# Patient Record
Sex: Female | Born: 1980 | Hispanic: No | Marital: Single | Smoking: Former smoker
Health system: Southern US, Community
[De-identification: ages and names within clinical notes are randomized; demographics above are authoritative.]

## PROBLEM LIST (undated history)

## (undated) DIAGNOSIS — J45909 Unspecified asthma, uncomplicated: Secondary | ICD-10-CM

## (undated) DIAGNOSIS — L409 Psoriasis, unspecified: Secondary | ICD-10-CM

## (undated) DIAGNOSIS — S81809A Unspecified open wound, unspecified lower leg, initial encounter: Secondary | ICD-10-CM

## (undated) DIAGNOSIS — B009 Herpesviral infection, unspecified: Secondary | ICD-10-CM

## (undated) DIAGNOSIS — E119 Type 2 diabetes mellitus without complications: Secondary | ICD-10-CM

## (undated) HISTORY — PX: BREAST MASS EXCISION: SHX1267

## (undated) HISTORY — DX: Herpesviral infection, unspecified: B00.9

## (undated) HISTORY — PX: APPENDECTOMY: SHX54

## (undated) HISTORY — DX: Psoriasis, unspecified: L40.9

## (undated) HISTORY — DX: Type 2 diabetes mellitus without complications: E11.9

## (undated) HISTORY — DX: Unspecified asthma, uncomplicated: J45.909

---

## 2007-06-29 ENCOUNTER — Other Ambulatory Visit: Admission: RE | Admit: 2007-06-29 | Discharge: 2007-06-29 | Payer: Self-pay | Admitting: *Deleted

## 2008-05-21 ENCOUNTER — Emergency Department (HOSPITAL_COMMUNITY): Admission: EM | Admit: 2008-05-21 | Discharge: 2008-05-21 | Payer: Self-pay | Admitting: Emergency Medicine

## 2010-05-06 IMAGING — CR DG TIBIA/FIBULA 2V BILAT
4 series · 4 of 4 positions shown · non-contrast
Comparison: None.

CLINICAL DATA: Bilateral lower leg trauma.

BILATERAL TIBIA AND FIBULA - 2 VIEW EACH 05/21/2008:

[view not recorded (1 of 4)]
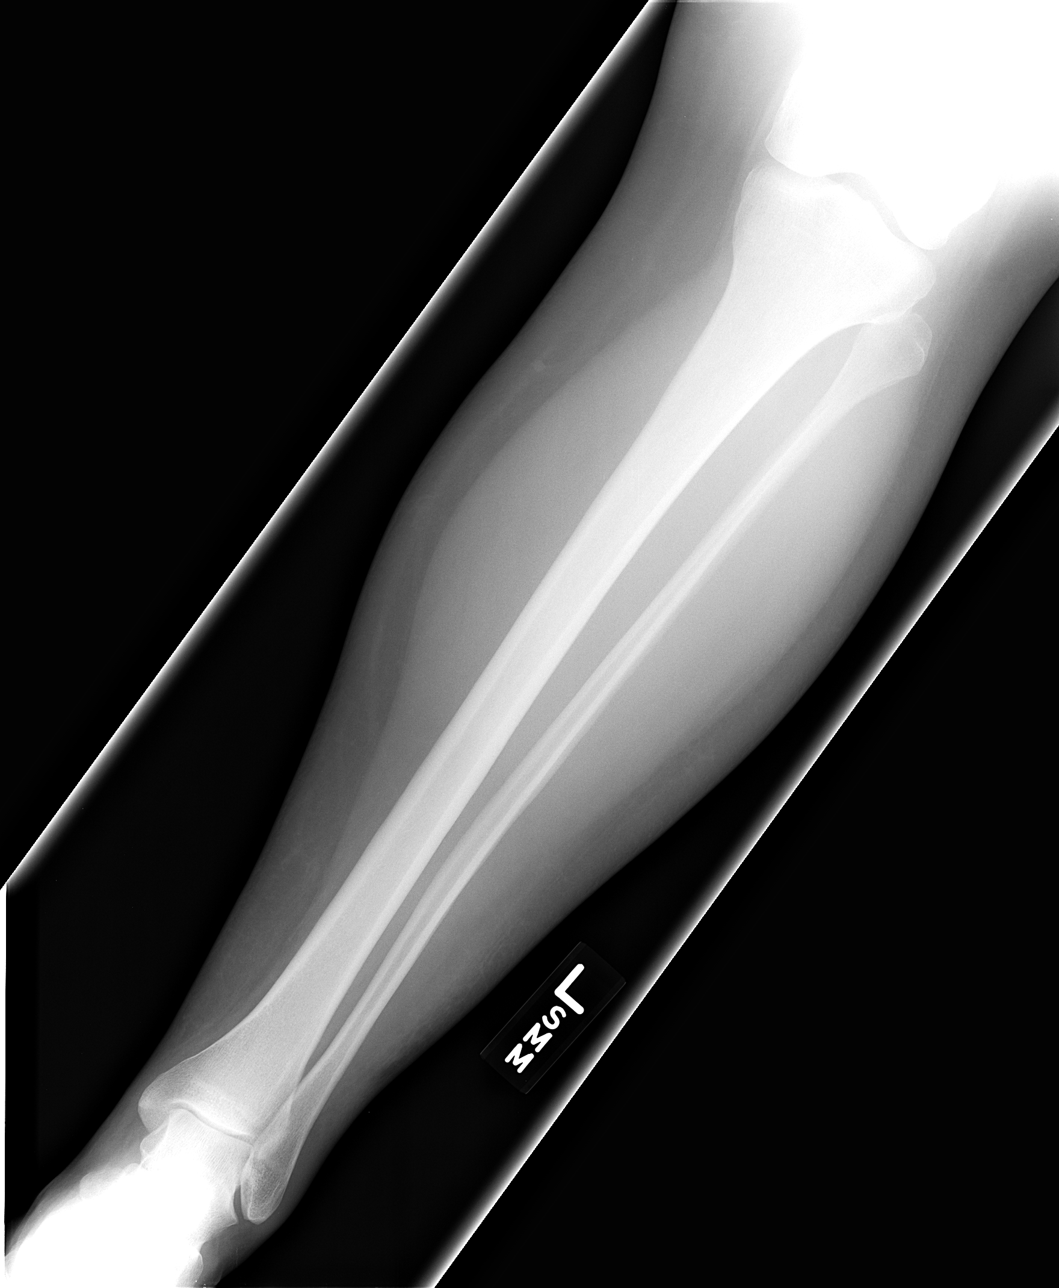

[view not recorded (2 of 4)]
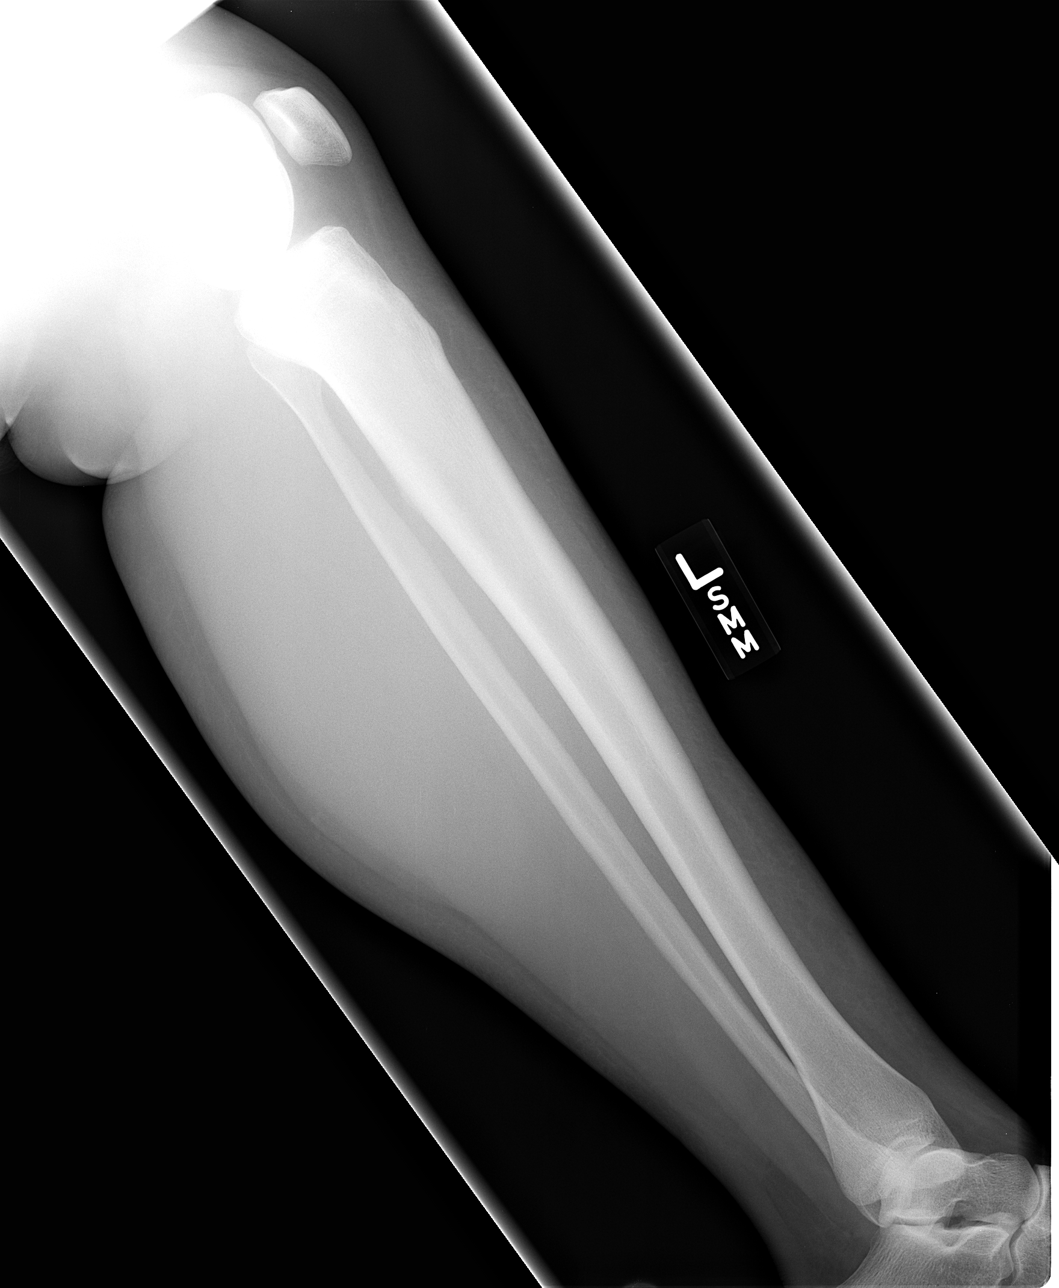

[view not recorded (3 of 4)]
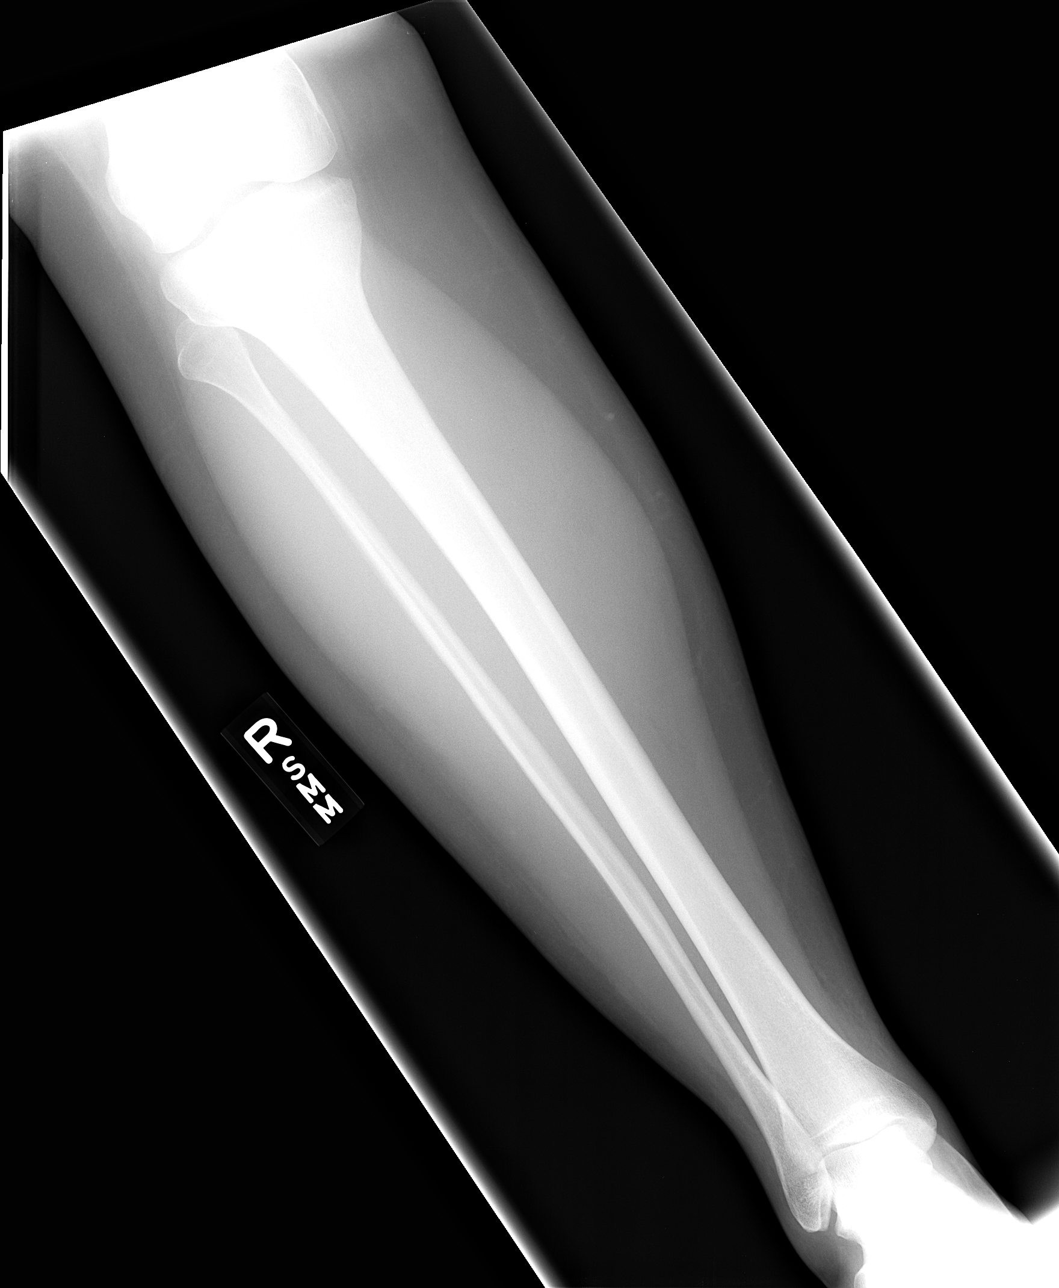

[view not recorded (4 of 4)]
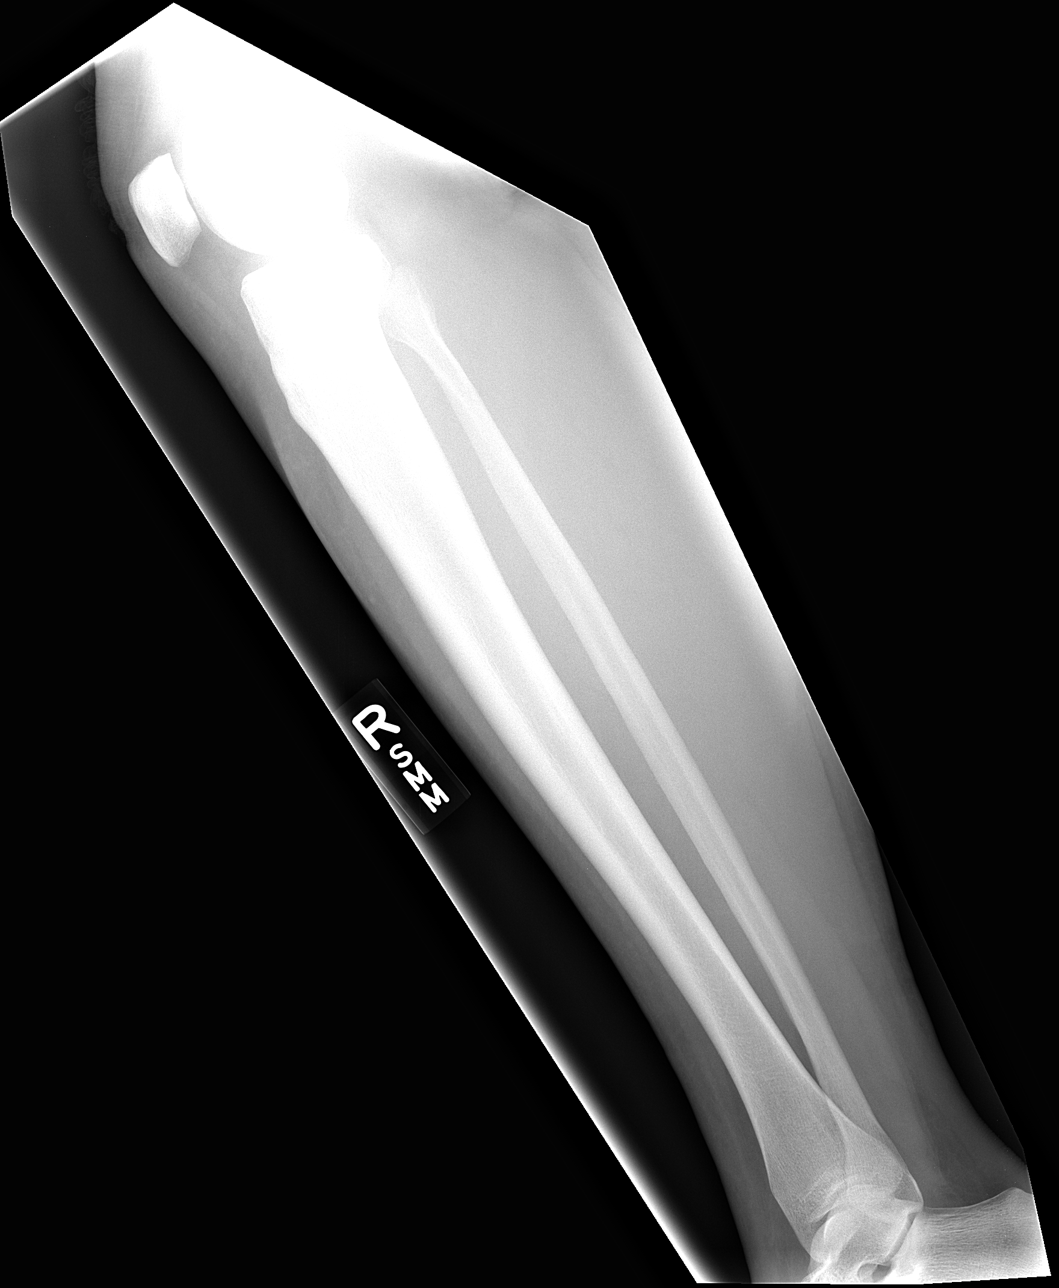

[4 of 4 positions shown; findings below may reference images not displayed]

FINDINGS: Left:  No evidence of acute fracture involving the tibia
or fibula.  Knee joint and ankle joint intact.  No intrinsic
osseous abnormalities.

Right:  No evidence of acute fracture involving the tibia or
fibula.  Knee joint and ankle joint intact.  No intrinsic osseous
abnormalities.
IMPRESSION: Normal bilateral tibia-fibula.

REF:G3 DICTATED: 05/21/2008 [DATE]

## 2010-08-01 ENCOUNTER — Ambulatory Visit: Payer: Self-pay | Admitting: Psychology

## 2010-08-06 ENCOUNTER — Ambulatory Visit: Payer: Self-pay | Admitting: Psychology

## 2010-08-06 ENCOUNTER — Ambulatory Visit (INDEPENDENT_AMBULATORY_CARE_PROVIDER_SITE_OTHER): Payer: BC Managed Care – PPO | Admitting: Psychology

## 2010-08-06 DIAGNOSIS — F331 Major depressive disorder, recurrent, moderate: Secondary | ICD-10-CM

## 2010-08-06 DIAGNOSIS — F411 Generalized anxiety disorder: Secondary | ICD-10-CM

## 2010-08-20 ENCOUNTER — Ambulatory Visit (INDEPENDENT_AMBULATORY_CARE_PROVIDER_SITE_OTHER): Payer: BC Managed Care – PPO | Admitting: Psychology

## 2010-08-20 DIAGNOSIS — F411 Generalized anxiety disorder: Secondary | ICD-10-CM

## 2010-08-20 DIAGNOSIS — F331 Major depressive disorder, recurrent, moderate: Secondary | ICD-10-CM

## 2010-08-27 ENCOUNTER — Ambulatory Visit: Payer: BC Managed Care – PPO | Admitting: Psychology

## 2010-09-02 ENCOUNTER — Ambulatory Visit (INDEPENDENT_AMBULATORY_CARE_PROVIDER_SITE_OTHER): Payer: BC Managed Care – PPO | Admitting: Psychology

## 2010-09-02 DIAGNOSIS — F331 Major depressive disorder, recurrent, moderate: Secondary | ICD-10-CM

## 2010-09-02 DIAGNOSIS — F411 Generalized anxiety disorder: Secondary | ICD-10-CM

## 2010-09-10 ENCOUNTER — Ambulatory Visit (INDEPENDENT_AMBULATORY_CARE_PROVIDER_SITE_OTHER): Payer: BC Managed Care – PPO | Admitting: Psychology

## 2010-09-10 DIAGNOSIS — F411 Generalized anxiety disorder: Secondary | ICD-10-CM

## 2010-09-10 DIAGNOSIS — F331 Major depressive disorder, recurrent, moderate: Secondary | ICD-10-CM

## 2010-09-12 ENCOUNTER — Ambulatory Visit: Payer: BC Managed Care – PPO | Admitting: Psychology

## 2010-09-17 ENCOUNTER — Ambulatory Visit (INDEPENDENT_AMBULATORY_CARE_PROVIDER_SITE_OTHER): Payer: BC Managed Care – PPO | Admitting: Psychology

## 2010-09-17 DIAGNOSIS — F331 Major depressive disorder, recurrent, moderate: Secondary | ICD-10-CM

## 2010-09-17 DIAGNOSIS — F411 Generalized anxiety disorder: Secondary | ICD-10-CM

## 2010-09-24 ENCOUNTER — Ambulatory Visit (INDEPENDENT_AMBULATORY_CARE_PROVIDER_SITE_OTHER): Payer: BC Managed Care – PPO | Admitting: Psychology

## 2010-09-24 DIAGNOSIS — F331 Major depressive disorder, recurrent, moderate: Secondary | ICD-10-CM

## 2010-09-24 DIAGNOSIS — F411 Generalized anxiety disorder: Secondary | ICD-10-CM

## 2010-09-30 ENCOUNTER — Ambulatory Visit (INDEPENDENT_AMBULATORY_CARE_PROVIDER_SITE_OTHER): Payer: BC Managed Care – PPO | Admitting: Psychology

## 2010-09-30 DIAGNOSIS — F411 Generalized anxiety disorder: Secondary | ICD-10-CM

## 2010-09-30 DIAGNOSIS — F331 Major depressive disorder, recurrent, moderate: Secondary | ICD-10-CM

## 2010-10-15 ENCOUNTER — Ambulatory Visit: Payer: BC Managed Care – PPO | Admitting: Psychology

## 2010-10-21 ENCOUNTER — Ambulatory Visit (INDEPENDENT_AMBULATORY_CARE_PROVIDER_SITE_OTHER): Payer: BC Managed Care – PPO | Admitting: Psychology

## 2010-10-21 DIAGNOSIS — F331 Major depressive disorder, recurrent, moderate: Secondary | ICD-10-CM

## 2010-10-21 DIAGNOSIS — F411 Generalized anxiety disorder: Secondary | ICD-10-CM

## 2010-10-29 ENCOUNTER — Ambulatory Visit (INDEPENDENT_AMBULATORY_CARE_PROVIDER_SITE_OTHER): Payer: BC Managed Care – PPO | Admitting: Psychology

## 2010-10-29 DIAGNOSIS — F331 Major depressive disorder, recurrent, moderate: Secondary | ICD-10-CM

## 2010-10-29 DIAGNOSIS — F411 Generalized anxiety disorder: Secondary | ICD-10-CM

## 2010-11-05 ENCOUNTER — Ambulatory Visit (INDEPENDENT_AMBULATORY_CARE_PROVIDER_SITE_OTHER): Payer: BC Managed Care – PPO | Admitting: Psychology

## 2010-11-05 DIAGNOSIS — F411 Generalized anxiety disorder: Secondary | ICD-10-CM

## 2010-11-05 DIAGNOSIS — F331 Major depressive disorder, recurrent, moderate: Secondary | ICD-10-CM

## 2010-11-12 ENCOUNTER — Ambulatory Visit (INDEPENDENT_AMBULATORY_CARE_PROVIDER_SITE_OTHER): Payer: BC Managed Care – PPO | Admitting: Psychology

## 2010-11-12 DIAGNOSIS — F331 Major depressive disorder, recurrent, moderate: Secondary | ICD-10-CM

## 2010-11-12 DIAGNOSIS — F411 Generalized anxiety disorder: Secondary | ICD-10-CM

## 2010-11-19 ENCOUNTER — Ambulatory Visit (INDEPENDENT_AMBULATORY_CARE_PROVIDER_SITE_OTHER): Payer: BC Managed Care – PPO | Admitting: Psychology

## 2010-11-19 DIAGNOSIS — F411 Generalized anxiety disorder: Secondary | ICD-10-CM

## 2010-11-19 DIAGNOSIS — F331 Major depressive disorder, recurrent, moderate: Secondary | ICD-10-CM

## 2010-11-26 ENCOUNTER — Ambulatory Visit: Payer: BC Managed Care – PPO | Admitting: Psychology

## 2016-02-14 ENCOUNTER — Ambulatory Visit (INDEPENDENT_AMBULATORY_CARE_PROVIDER_SITE_OTHER): Payer: BLUE CROSS/BLUE SHIELD | Admitting: Urgent Care

## 2016-02-14 ENCOUNTER — Encounter: Payer: Self-pay | Admitting: Urgent Care

## 2016-02-14 VITALS — BP 110/68 | HR 84 | Temp 98.2°F | Resp 18 | Ht 63.0 in | Wt 176.0 lb

## 2016-02-14 DIAGNOSIS — B9789 Other viral agents as the cause of diseases classified elsewhere: Secondary | ICD-10-CM

## 2016-02-14 DIAGNOSIS — J069 Acute upper respiratory infection, unspecified: Secondary | ICD-10-CM | POA: Diagnosis not present

## 2016-02-14 DIAGNOSIS — J45909 Unspecified asthma, uncomplicated: Secondary | ICD-10-CM

## 2016-02-14 DIAGNOSIS — R0981 Nasal congestion: Secondary | ICD-10-CM

## 2016-02-14 DIAGNOSIS — R05 Cough: Secondary | ICD-10-CM

## 2016-02-14 DIAGNOSIS — J029 Acute pharyngitis, unspecified: Secondary | ICD-10-CM

## 2016-02-14 DIAGNOSIS — R52 Pain, unspecified: Secondary | ICD-10-CM | POA: Diagnosis not present

## 2016-02-14 DIAGNOSIS — E119 Type 2 diabetes mellitus without complications: Secondary | ICD-10-CM | POA: Diagnosis not present

## 2016-02-14 DIAGNOSIS — R059 Cough, unspecified: Secondary | ICD-10-CM

## 2016-02-14 LAB — POCT RAPID STREP A (OFFICE): RAPID STREP A SCREEN: NEGATIVE

## 2016-02-14 LAB — POCT INFLUENZA A/B
Influenza A, POC: NEGATIVE
Influenza B, POC: NEGATIVE

## 2016-02-14 MED ORDER — PSEUDOEPHEDRINE HCL ER 120 MG PO TB12: 120.0000 mg | ORAL_TABLET | Freq: Two times a day (BID) | ORAL | 3 refills | Status: DC

## 2016-02-14 MED ORDER — HYDROCODONE-HOMATROPINE 5-1.5 MG/5ML PO SYRP: 5.0000 mL | ORAL_SOLUTION | Freq: Every evening | ORAL | 0 refills | Status: DC | PRN

## 2016-02-14 MED ORDER — BENZONATATE 100 MG PO CAPS: 100.0000 mg | ORAL_CAPSULE | Freq: Three times a day (TID) | ORAL | 0 refills | Status: DC | PRN

## 2016-02-14 MED ORDER — ALBUTEROL SULFATE HFA 108 (90 BASE) MCG/ACT IN AERS: 2.0000 | INHALATION_SPRAY | Freq: Four times a day (QID) | RESPIRATORY_TRACT | 5 refills | Status: AC | PRN

## 2016-02-14 NOTE — Patient Instructions (Addendum)
Upper Respiratory Infection, Adult Most upper respiratory infections (URIs) are a viral infection of the air passages leading to the lungs. A URI affects the nose, throat, and upper air passages. The most common type of URI is nasopharyngitis and is typically referred to as "the common cold." URIs run their course and usually go away on their own. Most of the time, a URI does not require medical attention, but sometimes a bacterial infection in the upper airways can follow a viral infection. This is called a secondary infection. Sinus and middle ear infections are common types of secondary upper respiratory infections. Bacterial pneumonia can also complicate a URI. A URI can worsen asthma and chronic obstructive pulmonary disease (COPD). Sometimes, these complications can require emergency medical care and may be life threatening.  CAUSES Almost all URIs are caused by viruses. A virus is a type of germ and can spread from one person to another.  RISKS FACTORS You may be at risk for a URI if:   You smoke.   You have chronic heart or lung disease.  You have a weakened defense (immune) system.   You are very young or very old.   You have nasal allergies or asthma.  You work in crowded or poorly ventilated areas.  You work in health care facilities or schools. SIGNS AND SYMPTOMS  Symptoms typically develop 2-3 days after you come in contact with a cold virus. Most viral URIs last 7-10 days. However, viral URIs from the influenza virus (flu virus) can last 14-18 days and are typically more severe. Symptoms may include:   Runny or stuffy (congested) nose.   Sneezing.   Cough.   Sore throat.   Headache.   Fatigue.   Fever.   Loss of appetite.   Pain in your forehead, behind your eyes, and over your cheekbones (sinus pain).  Muscle aches.  DIAGNOSIS  Your health care provider may diagnose a URI by:  Physical exam.  Tests to check that your symptoms are not due to  another condition such as:  Strep throat.  Sinusitis.  Pneumonia.  Asthma. TREATMENT  A URI goes away on its own with time. It cannot be cured with medicines, but medicines may be prescribed or recommended to relieve symptoms. Medicines may help:  Reduce your fever.  Reduce your cough.  Relieve nasal congestion. HOME CARE INSTRUCTIONS   Take medicines only as directed by your health care provider.   Gargle warm saltwater or take cough drops to comfort your throat as directed by your health care provider.  Use a warm mist humidifier or inhale steam from a shower to increase air moisture. This may make it easier to breathe.  Drink enough fluid to keep your urine clear or pale yellow.   Eat soups and other clear broths and maintain good nutrition.   Rest as needed.   Return to work when your temperature has returned to normal or as your health care provider advises. You may need to stay home longer to avoid infecting others. You can also use a face mask and careful hand washing to prevent spread of the virus.  Increase the usage of your inhaler if you have asthma.   Do not use any tobacco products, including cigarettes, chewing tobacco, or electronic cigarettes. If you need help quitting, ask your health care provider. PREVENTION  The best way to protect yourself from getting a cold is to practice good hygiene.   Avoid oral or hand contact with people with cold   symptoms.   Wash your hands often if contact occurs.  There is no clear evidence that vitamin C, vitamin E, echinacea, or exercise reduces the chance of developing a cold. However, it is always recommended to get plenty of rest, exercise, and practice good nutrition.  SEEK MEDICAL CARE IF:   You are getting worse rather than better.   Your symptoms are not controlled by medicine.   You have chills.  You have worsening shortness of breath.  You have brown or red mucus.  You have yellow or brown nasal  discharge.  You have pain in your face, especially when you bend forward.  You have a fever.  You have swollen neck glands.  You have pain while swallowing.  You have white areas in the back of your throat. SEEK IMMEDIATE MEDICAL CARE IF:   You have severe or persistent:  Headache.  Ear pain.  Sinus pain.  Chest pain.  You have chronic lung disease and any of the following:  Wheezing.  Prolonged cough.  Coughing up blood.  A change in your usual mucus.  You have a stiff neck.  You have changes in your:  Vision.  Hearing.  Thinking.  Mood. MAKE SURE YOU:   Understand these instructions.  Will watch your condition.  Will get help right away if you are not doing well or get worse.   This information is not intended to replace advice given to you by your health care provider. Make sure you discuss any questions you have with your health care provider.   Document Released: 09/30/2000 Document Revised: 08/21/2014 Document Reviewed: 07/12/2013 Elsevier Interactive Patient Education 2016 [employer]    Cough, Adult Coughing is a reflex that clears your throat and your airways. Coughing helps to heal and protect your lungs. It is normal to cough occasionally, but a cough that happens with other symptoms or lasts a long time may be a sign of a condition that needs treatment. A cough may last only 2-3 weeks (acute), or it may last longer than 8 weeks (chronic). CAUSES Coughing is commonly caused by:  Breathing in substances that irritate your lungs.  A viral or bacterial respiratory infection.  Allergies.  Asthma.  Postnasal drip.  Smoking.  Acid backing up from the stomach into the esophagus (gastroesophageal reflux).  Certain medicines.  Chronic lung problems, including COPD (or rarely, lung cancer).  Other medical conditions such as heart failure. HOME CARE INSTRUCTIONS  Pay attention to any changes in your symptoms. Take these actions to  help with your discomfort:  Take medicines only as told by your health care provider.  If you were prescribed an antibiotic medicine, take it as told by your health care provider. Do not stop taking the antibiotic even if you start to feel better.  Talk with your health care provider before you take a cough suppressant medicine.  Drink enough fluid to keep your urine clear or pale yellow.  If the air is dry, use a cold steam vaporizer or humidifier in your bedroom or your home to help loosen secretions.  Avoid anything that causes you to cough at work or at home.  If your cough is worse at night, try sleeping in a semi-upright position.  Avoid cigarette smoke. If you smoke, quit smoking. If you need help quitting, ask your health care provider.  Avoid caffeine.  Avoid alcohol.  Rest as needed. SEEK MEDICAL CARE IF:   You have new symptoms.  You cough up pus.  Your cough does not get better after 2-3 weeks, or your cough gets worse.  You cannot control your cough with suppressant medicines and you are losing sleep.  You develop pain that is getting worse or pain that is not controlled with pain medicines.  You have a fever.  You have unexplained weight loss.  You have night sweats. SEEK IMMEDIATE MEDICAL CARE IF:  You cough up blood.  You have difficulty breathing.  Your heartbeat is very fast.   This information is not intended to replace advice given to you by your health care provider. Make sure you discuss any questions you have with your health care provider.   Document Released: 10/03/2010 Document Revised: 12/26/2014 Document Reviewed: 06/13/2014 Elsevier Interactive Patient Education 2016 [employer].     IF you received an x-ray today, you will receive an invoice from [facility]. Please contact [facility] at [phone] with questions or concerns regarding your invoice.   IF you received labwork today, you will receive an  invoice from [employer]. Please contact [facility] at [phone] with questions or concerns regarding your invoice.   Our billing staff will not be able to assist you with questions regarding bills from these companies.  You will be contacted with the lab results as soon as they are available. The fastest way to get your results is to activate your My Chart account. Instructions are located on the last page of this paperwork. If you have not heard from [place] regarding the results in 2 weeks, please contact this office.

## 2016-02-14 NOTE — Progress Notes (Addendum)
By signing my name below, I, Danielle Peck, attest that this documentation has been prepared under the direction and in the presence of Jaynee Eagles, [place].  Electronically Signed: Verlee Monte, Medical Scribe. 02/14/16. 11:56 AM.  MRN: [id number] DOB: 06-Nov-1980  Subjective:   Danielle Peck is a 35 y.o. female with a PMHx of asthma and DM presenting for chief complaint of sore throat onset yesterday. Reports dry cough, shob, chest pain, chest tightness, chest congestion, headache, sore throat, dizziness, sinus pain, bilateral ear pain, decreased appetite and myalgia. She would like to get a refill on albuterol given her shob. Denies having had her flu shot this season. 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 Blood sugar is about 120 in the morning and she's compliant with Victoza. Pt states she is prone to ear infections and pneumonia. She drinks 1 alcohol drink per day and smokes cigarettes, but refuses to report the quantity. Denies PMHx of COPD. Denies fever, nausea, vomiting, abdominal pain, ear drainage, and rashes.  Current Meds  Medication Sig   albuterol (PROVENTIL HFA;VENTOLIN HFA) 108 (90 Base) MCG/ACT inhaler Inhale into the lungs every 6 (six) hours as needed for wheezing or shortness of breath.   ALPRAZolam (XANAX) 0.5 MG tablet Take 0.5 mg by mouth at bedtime as needed for anxiety.   liraglutide (VICTOZA) 18 MG/3ML SOPN Inject into the skin.   sertraline (ZOLOFT) 25 MG tablet Take 25 mg by mouth daily.   Danielle Peck has No Known Allergies.  Danielle Peck  has a past medical history of Asthma; Diabetes mellitus without complication ([place]); HSV-2 infection; and Psoriasis. Also  has a past surgical history that includes Appendectomy.  Objective:   Vitals: BP 110/68 (BP Location: Right Arm, Patient Position: Sitting, Cuff Size: Small)    Pulse 84    Temp 98.2 F (36.8 C) (Oral)    Resp 18    Ht 5\' 3"  (1.6 m)    Wt 176 lb (79.8 kg)    SpO2 97%    BMI 31.18 kg/m    Physical Exam  Constitutional: She is oriented to person, place, and time. She appears well-developed and well-nourished.  HENT:  Right Ear: Tympanic membrane and ear canal normal.  Left Ear: Tympanic membrane and ear canal normal.  Nose: Right sinus exhibits no maxillary sinus tenderness and no frontal sinus tenderness. Left sinus exhibits no maxillary sinus tenderness and no frontal sinus tenderness.  Mouth/Throat: Posterior oropharyngeal erythema present.  TM's intact bilaterally, no effusions or erythema. Nasal turbinates erythematous and mildly edematous but nasal passages patent. No sinus tenderness. Oropharynx with slight erythema and mild post-nasal drainage but no exudates, mucous membranes moist, dentition in good repair.  Eyes: Right eye exhibits no discharge. Left eye exhibits no discharge. No scleral icterus.  Cardiovascular: Normal rate, regular rhythm and intact distal pulses.  Exam reveals no gallop and no friction rub.   No murmur heard. Pulmonary/Chest: No respiratory distress. She has no wheezes. She has no rales.  Lymphadenopathy:    She has no cervical adenopathy.  Neurological: She is alert and oriented to person, place, and time.  Skin: Skin is warm and dry.   Results for orders placed or performed in visit on 02/14/16 (from the past 24 hour(s))  POCT Influenza A/B     Status: None   Collection Time: 02/14/16 12:27 PM  Result Value Ref Range   Influenza A, POC Negative Negative   Influenza B, POC Negative Negative  POCT rapid  strep A     Status: None   Collection Time: 02/14/16 12:27 PM  Result Value Ref Range   Rapid Strep A Screen Negative Negative   Assessment and Plan :   1. Viral URI with cough 2. Cough 3. Sore throat 4. Body aches 5. Sinus congestion - Recommended supportive care, cough suppression. Provided with 2 work notes for her 2 jobs. RTC in 1 week if no improvement.  6. Extrinsic asthma without complication, unspecified asthma severity,  unspecified whether persistent - Refilled albuterol inhaler. Schedule 2-3 times daily due to cough, shob.   7. Type 2 diabetes mellitus without complication, without long-term current use of insulin (HCC) - Stable per patient's report of fasting blood sugar checks.   Jaynee Eagles, PA-C [facility] [phone] 02/14/2016 11:56 AM

## 2016-02-14 NOTE — Addendum Note (Signed)
Addended by: Burnis Kingfisher on: 02/14/2016 05:21 PM   Modules accepted: Orders

## 2016-02-16 LAB — CULTURE, GROUP A STREP

## 2016-02-19 ENCOUNTER — Encounter: Payer: Self-pay | Admitting: Urgent Care

## 2016-04-20 DIAGNOSIS — S81809A Unspecified open wound, unspecified lower leg, initial encounter: Secondary | ICD-10-CM

## 2016-04-20 HISTORY — DX: Unspecified open wound, unspecified lower leg, initial encounter: S81.809A

## 2016-06-22 ENCOUNTER — Other Ambulatory Visit: Payer: Self-pay | Admitting: Obstetrics and Gynecology

## 2016-06-22 DIAGNOSIS — N63 Unspecified lump in unspecified breast: Secondary | ICD-10-CM

## 2016-06-24 ENCOUNTER — Ambulatory Visit
Admission: RE | Admit: 2016-06-24 | Discharge: 2016-06-24 | Disposition: A | Discharge status: Home / Self Care | Payer: BC Managed Care – PPO | Source: Ambulatory Visit | Attending: Obstetrics and Gynecology | Admitting: Obstetrics and Gynecology

## 2016-06-24 ENCOUNTER — Other Ambulatory Visit: Payer: Self-pay | Admitting: Obstetrics and Gynecology

## 2016-06-24 DIAGNOSIS — N63 Unspecified lump in unspecified breast: Secondary | ICD-10-CM

## 2016-06-24 DIAGNOSIS — N611 Abscess of the breast and nipple: Secondary | ICD-10-CM

## 2016-06-29 LAB — AEROBIC/ANAEROBIC CULTURE W GRAM STAIN (SURGICAL/DEEP WOUND)

## 2016-06-29 LAB — AEROBIC/ANAEROBIC CULTURE (SURGICAL/DEEP WOUND)

## 2016-06-30 ENCOUNTER — Ambulatory Visit
Admission: RE | Admit: 2016-06-30 | Discharge: 2016-06-30 | Disposition: A | Discharge status: Home / Self Care | Payer: BC Managed Care – PPO | Source: Ambulatory Visit | Attending: Obstetrics and Gynecology | Admitting: Obstetrics and Gynecology

## 2016-06-30 ENCOUNTER — Other Ambulatory Visit: Payer: Self-pay | Admitting: Obstetrics and Gynecology

## 2016-06-30 ENCOUNTER — Other Ambulatory Visit: Payer: BC Managed Care – PPO

## 2016-06-30 DIAGNOSIS — N611 Abscess of the breast and nipple: Secondary | ICD-10-CM

## 2016-07-03 ENCOUNTER — Encounter (HOSPITAL_COMMUNITY): Admission: EM | Disposition: A | Discharge status: Home / Self Care | Payer: Self-pay | Source: Home / Self Care

## 2016-07-03 ENCOUNTER — Inpatient Hospital Stay (HOSPITAL_COMMUNITY)
Admission: EM | Admit: 2016-07-03 | Discharge: 2016-07-06 | DRG: 855 | Disposition: A | Discharge status: Home / Self Care | Payer: BLUE CROSS/BLUE SHIELD | Attending: General Surgery | Admitting: General Surgery

## 2016-07-03 ENCOUNTER — Encounter (HOSPITAL_COMMUNITY): Payer: Self-pay | Admitting: *Deleted

## 2016-07-03 ENCOUNTER — Emergency Department (HOSPITAL_COMMUNITY): Payer: BLUE CROSS/BLUE SHIELD | Admitting: Anesthesiology

## 2016-07-03 ENCOUNTER — Ambulatory Visit
Admission: RE | Admit: 2016-07-03 | Discharge: 2016-07-03 | Disposition: A | Discharge status: Home / Self Care | Payer: BC Managed Care – PPO | Source: Ambulatory Visit | Attending: Obstetrics and Gynecology | Admitting: Obstetrics and Gynecology

## 2016-07-03 DIAGNOSIS — N611 Abscess of the breast and nipple: Secondary | ICD-10-CM | POA: Diagnosis present

## 2016-07-03 DIAGNOSIS — L409 Psoriasis, unspecified: Secondary | ICD-10-CM | POA: Diagnosis present

## 2016-07-03 DIAGNOSIS — M79661 Pain in right lower leg: Secondary | ICD-10-CM | POA: Diagnosis not present

## 2016-07-03 DIAGNOSIS — M79662 Pain in left lower leg: Secondary | ICD-10-CM | POA: Diagnosis not present

## 2016-07-03 DIAGNOSIS — E1165 Type 2 diabetes mellitus with hyperglycemia: Secondary | ICD-10-CM | POA: Diagnosis present

## 2016-07-03 DIAGNOSIS — M79609 Pain in unspecified limb: Secondary | ICD-10-CM | POA: Diagnosis not present

## 2016-07-03 DIAGNOSIS — A419 Sepsis, unspecified organism: Principal | ICD-10-CM | POA: Diagnosis present

## 2016-07-03 DIAGNOSIS — F419 Anxiety disorder, unspecified: Secondary | ICD-10-CM | POA: Diagnosis present

## 2016-07-03 DIAGNOSIS — J45909 Unspecified asthma, uncomplicated: Secondary | ICD-10-CM | POA: Diagnosis present

## 2016-07-03 DIAGNOSIS — F329 Major depressive disorder, single episode, unspecified: Secondary | ICD-10-CM | POA: Diagnosis present

## 2016-07-03 DIAGNOSIS — B951 Streptococcus, group B, as the cause of diseases classified elsewhere: Secondary | ICD-10-CM | POA: Diagnosis present

## 2016-07-03 HISTORY — PX: INCISION AND DRAINAGE OF WOUND: SHX1803

## 2016-07-03 LAB — CBC
HCT: 40.2 % (ref 36.0–46.0)
Hemoglobin: 14 g/dL (ref 12.0–15.0)
MCH: 31.8 pg (ref 26.0–34.0)
MCHC: 34.8 g/dL (ref 30.0–36.0)
MCV: 91.4 fL (ref 78.0–100.0)
PLATELETS: 308 10*3/uL (ref 150–400)
RBC: 4.4 MIL/uL (ref 3.87–5.11)
RDW: 11.9 % (ref 11.5–15.5)
WBC: 12 10*3/uL — ABNORMAL HIGH (ref 4.0–10.5)

## 2016-07-03 LAB — URINALYSIS, ROUTINE W REFLEX MICROSCOPIC
Bacteria, UA: NONE SEEN
Bilirubin Urine: NEGATIVE
HGB URINE DIPSTICK: NEGATIVE
Ketones, ur: NEGATIVE mg/dL
LEUKOCYTES UA: NEGATIVE
NITRITE: NEGATIVE
PH: 6 (ref 5.0–8.0)
Protein, ur: NEGATIVE mg/dL
SPECIFIC GRAVITY, URINE: 1.033 — AB (ref 1.005–1.030)

## 2016-07-03 LAB — COMPREHENSIVE METABOLIC PANEL
ALK PHOS: 69 U/L (ref 38–126)
ALT: 15 U/L (ref 14–54)
ANION GAP: 10 (ref 5–15)
AST: 19 U/L (ref 15–41)
Albumin: 3.8 g/dL (ref 3.5–5.0)
BUN: 7 mg/dL (ref 6–20)
CALCIUM: 8.8 mg/dL — AB (ref 8.9–10.3)
CHLORIDE: 101 mmol/L (ref 101–111)
CO2: 21 mmol/L — ABNORMAL LOW (ref 22–32)
CREATININE: 0.54 mg/dL (ref 0.44–1.00)
Glucose, Bld: 448 mg/dL — ABNORMAL HIGH (ref 65–99)
Potassium: 4 mmol/L (ref 3.5–5.1)
Sodium: 132 mmol/L — ABNORMAL LOW (ref 135–145)
Total Bilirubin: 0.4 mg/dL (ref 0.3–1.2)
Total Protein: 7 g/dL (ref 6.5–8.1)

## 2016-07-03 LAB — I-STAT VENOUS BLOOD GAS, ED
Acid-base deficit: 3 mmol/L — ABNORMAL HIGH (ref 0.0–2.0)
Bicarbonate: 22 mmol/L (ref 20.0–28.0)
O2 Saturation: 59 %
TCO2: 23 mmol/L (ref 0–100)
pCO2, Ven: 37.6 mmHg — ABNORMAL LOW (ref 44.0–60.0)
pH, Ven: 7.375 (ref 7.250–7.430)
pO2, Ven: 31 mmHg — CL (ref 32.0–45.0)

## 2016-07-03 LAB — GLUCOSE, CAPILLARY
GLUCOSE-CAPILLARY: 338 mg/dL — AB (ref 65–99)
Glucose-Capillary: 273 mg/dL — ABNORMAL HIGH (ref 65–99)
Glucose-Capillary: 282 mg/dL — ABNORMAL HIGH (ref 65–99)

## 2016-07-03 LAB — I-STAT CG4 LACTIC ACID, ED: LACTIC ACID, VENOUS: 3.8 mmol/L — AB (ref 0.5–1.9)

## 2016-07-03 SURGERY — IRRIGATION AND DEBRIDEMENT WOUND
Anesthesia: General | Laterality: Right

## 2016-07-03 MED ORDER — SUCCINYLCHOLINE CHLORIDE 20 MG/ML IJ SOLN
INTRAMUSCULAR | Status: DC | PRN
  Administered 2016-07-03: 100 mg via INTRAVENOUS

## 2016-07-03 MED ORDER — SUFENTANIL CITRATE 50 MCG/ML IV SOLN
INTRAVENOUS | Status: AC
  Filled 2016-07-03: qty 1

## 2016-07-03 MED ORDER — HYDROMORPHONE HCL 1 MG/ML IJ SOLN
0.2500 mg | INTRAMUSCULAR | Status: DC | PRN
  Administered 2016-07-03 (×3): 0.5 mg via INTRAVENOUS

## 2016-07-03 MED ORDER — ONDANSETRON HCL 4 MG/2ML IJ SOLN
INTRAMUSCULAR | Status: AC
  Filled 2016-07-03: qty 2

## 2016-07-03 MED ORDER — PIPERACILLIN-TAZOBACTAM 3.375 G IVPB
3.3750 g | Freq: Four times a day (QID) | INTRAVENOUS | Status: AC
  Administered 2016-07-03: 3.375 g via INTRAVENOUS
  Filled 2016-07-03: qty 50

## 2016-07-03 MED ORDER — VANCOMYCIN HCL IN DEXTROSE 1-5 GM/200ML-% IV SOLN
1000.0000 mg | Freq: Three times a day (TID) | INTRAVENOUS | Status: DC
  Administered 2016-07-04 – 2016-07-05 (×4): 1000 mg via INTRAVENOUS
  Filled 2016-07-03 (×5): qty 200

## 2016-07-03 MED ORDER — PIPERACILLIN-TAZOBACTAM 3.375 G IVPB
3.3750 g | Freq: Three times a day (TID) | INTRAVENOUS | Status: DC
  Administered 2016-07-04 – 2016-07-05 (×4): 3.375 g via INTRAVENOUS
  Filled 2016-07-03 (×5): qty 50

## 2016-07-03 MED ORDER — SERTRALINE HCL 50 MG PO TABS
25.0000 mg | ORAL_TABLET | Freq: Every day | ORAL | Status: DC
  Administered 2016-07-04 – 2016-07-06 (×3): 25 mg via ORAL
  Filled 2016-07-03 (×3): qty 1

## 2016-07-03 MED ORDER — LIDOCAINE 2% (20 MG/ML) 5 ML SYRINGE
INTRAMUSCULAR | Status: AC
  Filled 2016-07-03: qty 5

## 2016-07-03 MED ORDER — SODIUM CHLORIDE 0.9 % IV SOLN
INTRAVENOUS | Status: DC | PRN
  Administered 2016-07-03: 21:00:00 via INTRAVENOUS

## 2016-07-03 MED ORDER — MORPHINE SULFATE (PF) 4 MG/ML IV SOLN
4.0000 mg | Freq: Once | INTRAVENOUS | Status: AC
  Administered 2016-07-03: 4 mg via INTRAVENOUS
  Filled 2016-07-03: qty 1

## 2016-07-03 MED ORDER — SUGAMMADEX SODIUM 200 MG/2ML IV SOLN
INTRAVENOUS | Status: DC | PRN
  Administered 2016-07-03: 200 mg via INTRAVENOUS

## 2016-07-03 MED ORDER — HYDROMORPHONE HCL 1 MG/ML IJ SOLN
INTRAMUSCULAR | Status: AC
  Filled 2016-07-03: qty 1

## 2016-07-03 MED ORDER — HYDROMORPHONE HCL 1 MG/ML IJ SOLN
INTRAMUSCULAR | Status: AC
  Filled 2016-07-03: qty 0.5

## 2016-07-03 MED ORDER — PROPOFOL 10 MG/ML IV BOLUS
INTRAVENOUS | Status: AC
  Filled 2016-07-03: qty 20

## 2016-07-03 MED ORDER — PIPERACILLIN-TAZOBACTAM 3.375 G IVPB 30 MIN
3.3750 g | INTRAVENOUS | Status: DC
  Filled 2016-07-03 (×2): qty 50

## 2016-07-03 MED ORDER — ALBUTEROL SULFATE (2.5 MG/3ML) 0.083% IN NEBU: 3.0000 mL | INHALATION_SOLUTION | Freq: Four times a day (QID) | RESPIRATORY_TRACT | Status: DC | PRN

## 2016-07-03 MED ORDER — SODIUM CHLORIDE 0.9 % IV BOLUS (SEPSIS)
1000.0000 mL | Freq: Once | INTRAVENOUS | Status: AC
  Administered 2016-07-03: 1000 mL via INTRAVENOUS

## 2016-07-03 MED ORDER — INSULIN ASPART 100 UNIT/ML ~~LOC~~ SOLN
SUBCUTANEOUS | Status: AC
  Filled 2016-07-03: qty 1

## 2016-07-03 MED ORDER — ROCURONIUM BROMIDE 100 MG/10ML IV SOLN
INTRAVENOUS | Status: DC | PRN
  Administered 2016-07-03: 20 mg via INTRAVENOUS

## 2016-07-03 MED ORDER — LIDOCAINE HCL (CARDIAC) 20 MG/ML IV SOLN
INTRAVENOUS | Status: DC | PRN
  Administered 2016-07-03: 40 mg via INTRATRACHEAL

## 2016-07-03 MED ORDER — ONDANSETRON 4 MG PO TBDP: 4.0000 mg | ORAL_TABLET | Freq: Four times a day (QID) | ORAL | Status: DC | PRN

## 2016-07-03 MED ORDER — ENOXAPARIN SODIUM 40 MG/0.4ML ~~LOC~~ SOLN
40.0000 mg | SUBCUTANEOUS | Status: DC
  Administered 2016-07-04 – 2016-07-06 (×3): 40 mg via SUBCUTANEOUS
  Filled 2016-07-03 (×3): qty 0.4

## 2016-07-03 MED ORDER — HYDROMORPHONE HCL 1 MG/ML IJ SOLN
INTRAMUSCULAR | Status: DC | PRN
  Administered 2016-07-03 (×2): 0.5 mg via INTRAVENOUS

## 2016-07-03 MED ORDER — SUFENTANIL CITRATE 50 MCG/ML IV SOLN
INTRAVENOUS | Status: DC | PRN
  Administered 2016-07-03: 10 ug via INTRAVENOUS

## 2016-07-03 MED ORDER — SODIUM CHLORIDE 0.9 % IV SOLN
INTRAVENOUS | Status: DC | PRN
  Administered 2016-07-03: 1000 mg via INTRAVENOUS

## 2016-07-03 MED ORDER — HYDROMORPHONE HCL 2 MG/ML IJ SOLN
1.0000 mg | INTRAMUSCULAR | Status: DC | PRN
  Administered 2016-07-04 (×2): 2 mg via INTRAVENOUS
  Administered 2016-07-05: 1 mg via INTRAVENOUS
  Administered 2016-07-05: 2 mg via INTRAVENOUS
  Filled 2016-07-03 (×4): qty 1

## 2016-07-03 MED ORDER — INSULIN ASPART 100 UNIT/ML ~~LOC~~ SOLN
0.0000 [IU] | SUBCUTANEOUS | Status: DC
  Administered 2016-07-03: 8 [IU] via SUBCUTANEOUS
  Filled 2016-07-03 (×49): qty 0.15

## 2016-07-03 MED ORDER — SODIUM CHLORIDE 0.9 % IV SOLN
INTRAVENOUS | Status: DC
  Administered 2016-07-03 – 2016-07-05 (×6): via INTRAVENOUS
  Administered 2016-07-06: 1 mL via INTRAVENOUS

## 2016-07-03 MED ORDER — SUCCINYLCHOLINE CHLORIDE 200 MG/10ML IV SOSY
PREFILLED_SYRINGE | INTRAVENOUS | Status: AC
  Filled 2016-07-03: qty 10

## 2016-07-03 MED ORDER — PROPOFOL 10 MG/ML IV BOLUS
INTRAVENOUS | Status: DC | PRN
  Administered 2016-07-03: 120 mg via INTRAVENOUS

## 2016-07-03 MED ORDER — 0.9 % SODIUM CHLORIDE (POUR BTL) OPTIME
TOPICAL | Status: DC | PRN
  Administered 2016-07-03: 1000 mL

## 2016-07-03 MED ORDER — ALPRAZOLAM 0.5 MG PO TABS
0.5000 mg | ORAL_TABLET | Freq: Every evening | ORAL | Status: DC | PRN
  Administered 2016-07-03 – 2016-07-05 (×3): 0.5 mg via ORAL
  Filled 2016-07-03 (×3): qty 1

## 2016-07-03 MED ORDER — MIDAZOLAM HCL 2 MG/2ML IJ SOLN
INTRAMUSCULAR | Status: DC | PRN
  Administered 2016-07-03: 2 mg via INTRAVENOUS

## 2016-07-03 MED ORDER — HYDROMORPHONE HCL 1 MG/ML IJ SOLN
INTRAMUSCULAR | Status: AC
  Administered 2016-07-03: 0.5 mg via INTRAVENOUS
  Filled 2016-07-03: qty 1

## 2016-07-03 MED ORDER — SODIUM CHLORIDE 0.9 % IJ SOLN
INTRAMUSCULAR | Status: AC
  Filled 2016-07-03: qty 10

## 2016-07-03 MED ORDER — OXYCODONE HCL 5 MG PO TABS
5.0000 mg | ORAL_TABLET | ORAL | Status: DC | PRN
  Administered 2016-07-03 – 2016-07-06 (×13): 10 mg via ORAL
  Filled 2016-07-03 (×13): qty 2

## 2016-07-03 MED ORDER — INSULIN ASPART 100 UNIT/ML IV SOLN
6.0000 [IU] | Freq: Once | INTRAVENOUS | Status: AC
  Administered 2016-07-03: 10 [IU] via INTRAVENOUS
  Filled 2016-07-03: qty 0.06

## 2016-07-03 MED ORDER — ONDANSETRON HCL 4 MG/2ML IJ SOLN
4.0000 mg | Freq: Four times a day (QID) | INTRAMUSCULAR | Status: DC | PRN
  Administered 2016-07-03 – 2016-07-06 (×2): 4 mg via INTRAVENOUS
  Filled 2016-07-03 (×2): qty 2

## 2016-07-03 MED ORDER — ONDANSETRON HCL 4 MG/2ML IJ SOLN
INTRAMUSCULAR | Status: DC | PRN
  Administered 2016-07-03: 4 mg via INTRAVENOUS

## 2016-07-03 MED ORDER — VANCOMYCIN HCL IN DEXTROSE 1-5 GM/200ML-% IV SOLN
INTRAVENOUS | Status: AC
  Filled 2016-07-03: qty 200

## 2016-07-03 MED ORDER — MIDAZOLAM HCL 2 MG/2ML IJ SOLN
INTRAMUSCULAR | Status: AC
  Filled 2016-07-03: qty 2

## 2016-07-03 SURGICAL SUPPLY — 26 items
CANISTER SUCT 3000ML PPV (MISCELLANEOUS) ×3 IMPLANT
COVER SURGICAL LIGHT HANDLE (MISCELLANEOUS) ×3 IMPLANT
DRAPE LAPAROTOMY 100X72 PEDS (DRAPES) ×3 IMPLANT
DRAPE UTILITY XL STRL (DRAPES) ×12 IMPLANT
DRSG PAD ABDOMINAL 8X10 ST (GAUZE/BANDAGES/DRESSINGS) ×3 IMPLANT
ELECT CAUTERY BLADE 6.4 (BLADE) ×3 IMPLANT
ELECT REM PT RETURN 9FT ADLT (ELECTROSURGICAL) ×3
ELECTRODE REM PT RTRN 9FT ADLT (ELECTROSURGICAL) ×1 IMPLANT
GAUZE IODOFORM PACK 1/2 7832 (GAUZE/BANDAGES/DRESSINGS) ×3 IMPLANT
GAUZE SPONGE 4X4 12PLY STRL (GAUZE/BANDAGES/DRESSINGS) ×3 IMPLANT
GLOVE BIO SURGEON STRL SZ7.5 (GLOVE) ×3 IMPLANT
GLOVE BIOGEL PI IND STRL 8 (GLOVE) ×1 IMPLANT
GLOVE BIOGEL PI INDICATOR 8 (GLOVE) ×2
GOWN STRL REUS W/ TWL LRG LVL3 (GOWN DISPOSABLE) ×2 IMPLANT
GOWN STRL REUS W/ TWL XL LVL3 (GOWN DISPOSABLE) ×1 IMPLANT
GOWN STRL REUS W/TWL LRG LVL3 (GOWN DISPOSABLE) ×4
GOWN STRL REUS W/TWL XL LVL3 (GOWN DISPOSABLE) ×2
KIT BASIN OR (CUSTOM PROCEDURE TRAY) ×3 IMPLANT
KIT ROOM TURNOVER OR (KITS) ×3 IMPLANT
NS IRRIG 1000ML POUR BTL (IV SOLUTION) ×3 IMPLANT
PACK GENERAL/GYN (CUSTOM PROCEDURE TRAY) ×3 IMPLANT
PAD ARMBOARD 7.5X6 YLW CONV (MISCELLANEOUS) ×3 IMPLANT
SWAB COLLECTION DEVICE MRSA (MISCELLANEOUS) ×3 IMPLANT
TAPE CLOTH SURG 6X10 WHT LF (GAUZE/BANDAGES/DRESSINGS) ×3 IMPLANT
TOWEL OR 17X26 10 PK STRL BLUE (TOWEL DISPOSABLE) ×3 IMPLANT
TUBE ANAEROBIC SPECIMEN COL (MISCELLANEOUS) ×3 IMPLANT

## 2016-07-03 NOTE — Anesthesia Preprocedure Evaluation (Addendum)
Anesthesia Evaluation  Patient identified by MRN, date of birth, ID band Patient awake    Reviewed: Allergy & Precautions, H&P , NPO status , Patient's Chart, lab work & pertinent test results  Airway Mallampati: II  TM Distance: >3 FB Neck ROM: Full    Dental no notable dental hx. (+) Teeth Intact, Dental Advisory Given   Pulmonary asthma ,    Pulmonary exam normal breath sounds clear to auscultation       Cardiovascular negative cardio ROS   Rhythm:Regular Rate:Normal     Neuro/Psych negative neurological ROS  negative psych ROS   GI/Hepatic negative GI ROS, Neg liver ROS,   Endo/Other  diabetes, Poorly Controlled, Oral Hypoglycemic Agents  Renal/GU negative Renal ROS  negative genitourinary   Musculoskeletal   Abdominal   Peds  Hematology negative hematology ROS (+)   Anesthesia Other Findings   Reproductive/Obstetrics negative OB ROS                            Anesthesia Physical Anesthesia Plan  ASA: II and emergent  Anesthesia Plan: General   Post-op Pain Management:    Induction: Intravenous, Rapid sequence and Cricoid pressure planned  Airway Management Planned: Oral ETT  Additional Equipment:   Intra-op Plan:   Post-operative Plan: Extubation in OR  Informed Consent: I have reviewed the patients History and Physical, chart, labs and discussed the procedure including the risks, benefits and alternatives for the proposed anesthesia with the patient or authorized representative who has indicated his/her understanding and acceptance.   Dental advisory given  Plan Discussed with: CRNA  Anesthesia Plan Comments:        Anesthesia Quick Evaluation

## 2016-07-03 NOTE — Op Note (Signed)
07/03/2016  9:23 PM  PATIENT:  Danielle Peck  36 y.o. female  PRE-OPERATIVE DIAGNOSIS:  Right breast abscess  POST-OPERATIVE DIAGNOSIS:  Right breast abscess  PROCEDURE:  Procedure(s): IRRIGATION AND DEBRIDEMENT WOUND (Right)  SURGEON:  Surgeon(s) and Role:    * Ralene Ok, MD - Primary  ANESTHESIA:   general  EBL:  Total I/O3/16/2018  9:23 PM  PATIENT:  Danielle Rochester Casciano  36 y.o. female  PRE-OPERATIVE DIAGNOSIS:  Right infected breast  POST-OPERATIVE DIAGNOSIS:  Right infected breast  PROCEDURE:  Procedure(s): IRRIGATION AND DEBRIDEMENT WOUND (Right)  SURGEON:  Surgeon(s) and Role:    * Ralene Ok, MD - Primary  PHYSICIAN ASSISTANT:   ASSISTANTS: none   ANESTHESIA:   general  EBL:10cc  Total I/O In: 1000 [I.V.:1000] Out: -   BLOOD ADMINISTERED:none  DRAINS: none   LOCAL MEDICATIONS USED:  NONE  SPECIMEN:  Source of Specimen:  Right breast abscess  DISPOSITION OF SPECIMEN:  Microbiology  COUNTS:  YES  TOURNIQUET:  * No tourniquets in log *  DICTATION: .Dragon Dictation  Indications for procedure: Patient is a 36 year old female who had previously undergone antibiotic therapy as well as needle aspiration of right breast abscess. This continued to progress over 2 weeks. Patient was seen in the ER and was taken to the OR for urgent I&D of right breast abscess.  Details of procedure: After the patient was consented she was taken back to the operating room and placed in supine position with bilateral testes place. After appropriate antibiotic for confirmatory facts verified.  This time a curvilinear incision is made over the area of the breast abscess. Large amount of purulence was expressed. Aerobic and anaerobic cultures were obtained. All loculations of the breast abscess were broken up using blunt dissection. There was approximately a cavity that measured approximate 5 x 6 cm. This tracked from the 7:00 position to towards the subareolar position.  This abscess cavity was irrigated out with sterile saline. At this time the area was checked for hemostasis. The area was then packed with half-inch iodoform gauze. The wound was dressed with 4 x 4's, ABDs pad, and tape. The patient tied the procedure well was taken to the recovery room stable condition.  PLAN OF CARE: Admit to inpatient   PATIENT DISPOSITION:  PACU - hemodynamically stable.

## 2016-07-03 NOTE — H&P (Signed)
Danielle Peck is an 36 y.o. female.   Chief Complaint: right breast  HPI: Pt is a 36 y/o F with what she states a two-week history of a breast abscess. Patient has been seen in the breast clinic over the last 2 weeks. She's been on antibiotics and has had several needle aspirations and ultrasounds. Patient was seen today in was reevaluated with ultrasound which revealed a moderate-sized right breast abscess. Patient was to follow up with [place] in 2 days in clinic however secondary to continued pain patient said to proceed to the ER for further evaluation and management.  Patient states that she feels that the abscesses gotten worse. She states she has pain in her right breast.  Patient is requesting to be taken to the OR for I&D of abscess.  Ultrasound from today reveals: EXAM: ULTRASOUND OF THE RIGHT BREAST  COMPARISON:  Previous exam(s).  FINDINGS: Physical examination of the right breast reveals enlargement and firmness with marked erythema involving the lower outer right breast. There is a purulent drainage emanating from an open wound. The patient is exquisitely tender to palpation.  Targeted ultrasound of the right breast was performed demonstrating a large complex collection compatible with an abscess at the 8 to 9 o'clock position 4-5 cm from nipple. This measures at least 5.6 x 2.6 x 4.5 cm. Due to large size of the abscess, it could not be entirely included in the field of view. There adjacent skin thickening and edema with increased vascularity.  IMPRESSION: Enlarging right breast abscess, despite antibiotics and two ultrasound-guided abscess aspirations.    Past Medical History:  Diagnosis Date  . Asthma   . Diabetes mellitus without complication ([place])   . HSV-2 infection   . Psoriasis     Past Surgical History:  Procedure Laterality Date  . APPENDECTOMY      No family history on file. Social History:  reports that she has never smoked. She has never used  smokeless tobacco. She reports that she drinks alcohol. She reports that she does not use drugs.  Allergies: No Known Allergies   (Not in a hospital admission)  Results for orders placed or performed during the hospital encounter of 07/03/16 (from the past 48 hour(s))  CBC     Status: Abnormal   Collection Time: 07/03/16  2:54 PM  Result Value Ref Range   WBC 12.0 (H) 4.0 - 10.5 K/uL   RBC 4.40 3.87 - 5.11 MIL/uL   Hemoglobin 14.0 12.0 - 15.0 g/dL   HCT 40.2 36.0 - 46.0 %   MCV 91.4 78.0 - 100.0 fL   MCH 31.8 26.0 - 34.0 pg   MCHC 34.8 30.0 - 36.0 g/dL   RDW 11.9 11.5 - 15.5 %   Platelets 308 150 - 400 K/uL  Comprehensive metabolic panel     Status: Abnormal   Collection Time: 07/03/16  2:54 PM  Result Value Ref Range   Sodium 132 (L) 135 - 145 mmol/L   Potassium 4.0 3.5 - 5.1 mmol/L   Chloride 101 101 - 111 mmol/L   CO2 21 (L) 22 - 32 mmol/L   Glucose, Bld 448 (H) 65 - 99 mg/dL   BUN 7 6 - 20 mg/dL   Creatinine, Ser 0.54 0.44 - 1.00 mg/dL   Calcium 8.8 (L) 8.9 - 10.3 mg/dL   Total Protein 7.0 6.5 - 8.1 g/dL   Albumin 3.8 3.5 - 5.0 g/dL   AST 19 15 - 41 U/L   ALT 15 14 -  54 U/L   Alkaline Phosphatase 69 38 - 126 U/L   Total Bilirubin 0.4 0.3 - 1.2 mg/dL   GFR calc non Af Amer >60 >60 mL/min   GFR calc Af Amer >60 >60 mL/min    Comment: (NOTE) The eGFR has been calculated using the CKD EPI equation. This calculation has not been validated in all clinical situations. eGFR's persistently <60 mL/min signify possible Chronic Kidney Disease.    Anion gap 10 5 - 15  Urinalysis, Routine w reflex microscopic     Status: Abnormal   Collection Time: 07/03/16  7:33 PM  Result Value Ref Range   Color, Urine YELLOW YELLOW   APPearance HAZY (A) CLEAR   Specific Gravity, Urine 1.033 (H) 1.005 - 1.030   pH 6.0 5.0 - 8.0   Glucose, UA >=500 (A) NEGATIVE mg/dL   Hgb urine dipstick NEGATIVE NEGATIVE   Bilirubin Urine NEGATIVE NEGATIVE   Ketones, ur NEGATIVE NEGATIVE mg/dL    Protein, ur NEGATIVE NEGATIVE mg/dL   Nitrite NEGATIVE NEGATIVE   Leukocytes, UA NEGATIVE NEGATIVE   RBC / HPF 0-5 0 - 5 RBC/hpf   WBC, UA 0-5 0 - 5 WBC/hpf   Bacteria, UA NONE SEEN NONE SEEN   Squamous Epithelial / LPF 0-5 (A) NONE SEEN   Mucous PRESENT   I-Stat venous blood gas, ED     Status: Abnormal   Collection Time: 07/03/16  7:41 PM  Result Value Ref Range   pH, Ven 7.375 7.250 - 7.430   pCO2, Ven 37.6 (L) 44.0 - 60.0 mmHg   pO2, Ven 31.0 (LL) 32.0 - 45.0 mmHg   Bicarbonate 22.0 20.0 - 28.0 mmol/L   TCO2 23 0 - 100 mmol/L   O2 Saturation 59.0 %   Acid-base deficit 3.0 (H) 0.0 - 2.0 mmol/L   Patient temperature 98.6 F    Sample type VENOUS    Comment NOTIFIED PHYSICIAN   I-Stat CG4 Lactic Acid, ED     Status: Abnormal   Collection Time: 07/03/16  7:43 PM  Result Value Ref Range   Lactic Acid, Venous 3.80 (HH) 0.5 - 1.9 mmol/L   Comment NOTIFIED PHYSICIAN    US Breast Ltd Uni Right Inc Axilla  Result Date: 07/03/2016 CLINICAL DATA:  36 year old female with right breast abscess. The patient initially presented 06/24/2016 with right breast abscess. This was subsequently drained on the same day and patient returned for follow-up 06/30/2016 with worsening abscess formation. The patient's abscess again drained and she was placed on a course of ciprofloxacin. Since that time the patient states her symptoms have continued to worsen. She does not feel as if the current antibiotics are working. The patient states there is now a draining open wound. EXAM: ULTRASOUND OF THE RIGHT BREAST COMPARISON:  Previous exam(s). FINDINGS: Physical examination of the right breast reveals enlargement and firmness with marked erythema involving the lower outer right breast. There is a purulent drainage emanating from an open wound. The patient is exquisitely tender to palpation. Targeted ultrasound of the right breast was performed demonstrating a large complex collection compatible with an abscess at the 8  to 9 o'clock position 4-5 cm from nipple. This measures at least 5.6 x 2.6 x 4.5 cm. Due to large size of the abscess, it could not be entirely included in the field of view. There adjacent skin thickening and edema with increased vascularity. IMPRESSION: Enlarging right breast abscess, despite antibiotics and two ultrasound-guided abscess aspirations. RECOMMENDATION: Ultrasound-guided abscess drainage is recommended. The patient  is unsure if she can tolerate an additional abscess drainage due to extreme pain and an order from the patient's primary care provider for abscess drainage is still pending. Given the patient's worsening abscess referral to a breast surgeon is recommended. This is scheduled with [facility] Monday 07/06/2016 at 2:30 p.m. (earliest appointment available). The patient will be switched over to Bactrim as she feels her current antibiotic is not effective. Upon further speaking with the patient and the patient's mother, the patient will likely go to the emergency department for pain management (pain medication is not provided at the [facility]) and further management of her right breast abscess rather than having an ultrasound-guided abscess drainage. If the patient does not go to the emergency department then she should follow-up for her scheduled appointment with [facility] surgery on Monday. I have discussed the findings and recommendations with the patient. Results were also provided in writing at the conclusion of the visit. If applicable, a reminder letter will be sent to the patient regarding the next appointment. BI-RADS CATEGORY  2: Benign. Electronically Signed   By: Everlean Alstrom M.D.   On: 07/03/2016 14:31    Review of Systems  Constitutional: Negative for chills, fever and weight loss.  HENT: Negative for ear pain, hearing loss, nosebleeds and tinnitus.   Respiratory: Negative for cough, hemoptysis and sputum production.   Cardiovascular: Negative for  chest pain, palpitations, orthopnea and claudication.  Gastrointestinal: Negative for abdominal pain, heartburn, nausea and vomiting.  Musculoskeletal: Negative for back pain, myalgias and neck pain.  Neurological: Negative for dizziness, tingling, tremors and headaches.  All other systems reviewed and are negative.   Blood pressure 123/71, pulse 80, temperature 98.1 F (36.7 C), temperature source Oral, resp. rate 18, last menstrual period 07/01/2016, SpO2 99 %. Physical Exam  Constitutional: She is oriented to person, place, and time. She appears well-developed and well-nourished. No distress.  HENT:  Head: Normocephalic and atraumatic.  Right Ear: External ear normal.  Left Ear: External ear normal.  Eyes: Conjunctivae and EOM are normal. Pupils are equal, round, and reactive to light. Right eye exhibits no discharge. Left eye exhibits no discharge. No scleral icterus.  Neck: Normal range of motion. Neck supple. No tracheal deviation present. No thyromegaly present.  Cardiovascular: Normal rate, regular rhythm and normal heart sounds.  Exam reveals no gallop and no friction rub.   No murmur heard. Respiratory: Effort normal and breath sounds normal. No stridor. No respiratory distress. She has no wheezes. She has no rales. She exhibits tenderness and swelling. Right breast exhibits tenderness. Right breast exhibits no inverted nipple.    GI: Soft. Bowel sounds are normal. She exhibits no distension. There is no tenderness. There is no rebound and no guarding.  Musculoskeletal: Normal range of motion. She exhibits deformity. She exhibits no edema or tenderness.  Lymphadenopathy:    She has no cervical adenopathy.  Neurological: She is alert and oriented to person, place, and time.  Skin: Skin is warm and dry. She is not diaphoretic. There is erythema (right breast).     Assessment/Plan 36 year old female with right breast abscess and failure of medical management and needle  aspiration. DM Anxiety Depression  Psoriasis  1. We'll proceed to the operating room for a formal incision and drainage of the abscess. 2. Discussed with patient she will require close glucose management. 3. I discussed with patient this benefits of the procedure to include but not limited to: Infection, bleeding, damage to surrounding structures,  possible reoperation. The patient voiced understanding and wishes to proceed.  Reyes Ivan, MD 07/03/2016, 8:11 PM

## 2016-07-03 NOTE — ED Provider Notes (Signed)
[place] DEPT Provider Note   CSN: [id number] Arrival date & time: 07/03/16  1429     History   Chief Complaint Chief Complaint  Patient presents with  . Breast Pain    HPI Danielle Peck is a 36 y.o. female.  Patient with diabetes history on oral medications presents with worsening breast tenderness and swelling. Patient has had an IND outpatient recently and is on oral antibiotics. Patient unsure the name of the antibiotics. Patient feels she is getting worse despite outpatient treatment. Patient denies fevers or chills. No vomiting.      Past Medical History:  Diagnosis Date  . Asthma   . Diabetes mellitus without complication ([place])   . HSV-2 infection   . Psoriasis     There are no active problems to display for this patient.   Past Surgical History:  Procedure Laterality Date  . APPENDECTOMY      OB History    No data available       Home Medications    Prior to Admission medications   Medication Sig Start Date End Date Taking? Authorizing Provider  albuterol (PROVENTIL HFA;VENTOLIN HFA) 108 (90 Base) MCG/ACT inhaler Inhale 2 puffs into the lungs every 6 (six) hours as needed for wheezing or shortness of breath. 02/14/16   Jaynee Eagles, PA-C  ALPRAZolam Duanne Moron) 0.5 MG tablet Take 0.5 mg by mouth at bedtime as needed for anxiety.    Historical Provider, MD  benzonatate (TESSALON) 100 MG capsule Take 1-2 capsules (100-200 mg total) by mouth 3 (three) times daily as needed for cough. 02/14/16   Jaynee Eagles, PA-C  HYDROcodone-homatropine [facility]) 5-1.5 MG/5ML syrup Take 5 mLs by mouth at bedtime as needed. 02/14/16   Jaynee Eagles, PA-C  liraglutide (VICTOZA) 18 MG/3ML SOPN Inject into the skin.    Historical Provider, MD  pseudoephedrine (SUDAFED 12 HOUR) 120 MG 12 hr tablet Take 1 tablet (120 mg total) by mouth 2 (two) times daily. 02/14/16   Jaynee Eagles, PA-C  sertraline (ZOLOFT) 25 MG tablet Take 25 mg by mouth daily.    Historical Provider, MD    Family  History No family history on file.  Social History Social History  Substance Use Topics  . Smoking status: Never Smoker  . Smokeless tobacco: Never Used  . Alcohol use Yes     Allergies   Patient has no known allergies.   Review of Systems Review of Systems  Constitutional: Negative for chills and fever.  HENT: Negative for congestion.   Eyes: Negative for visual disturbance.  Respiratory: Negative for shortness of breath.   Cardiovascular: Negative for chest pain.  Gastrointestinal: Negative for abdominal pain and vomiting.  Genitourinary: Negative for dysuria and flank pain.  Musculoskeletal: Negative for back pain, neck pain and neck stiffness.  Skin: Positive for rash.  Neurological: Negative for light-headedness and headaches.     Physical Exam Updated Vital Signs BP 123/71   Pulse 80   Temp 98.1 F (36.7 C) (Oral)   Resp 18   LMP 07/01/2016 (Exact Date)   SpO2 99%   Physical Exam  Constitutional: She appears well-developed and well-nourished. No distress.  HENT:  Head: Normocephalic and atraumatic.  Dry mucous membranes  Eyes: Conjunctivae are normal.  Neck: Neck supple.  Cardiovascular: Normal rate and regular rhythm.   No murmur heard. Pulmonary/Chest: Effort normal and breath sounds normal. No respiratory distress.  Abdominal: Soft. There is no tenderness.  Musculoskeletal: She exhibits tenderness. She exhibits no edema.  Neurological: She  is alert.  Skin: Skin is warm and dry. There is erythema.  Patient has large approximately 5 cm diameter area of erythema induration and tenderness with fluctuance in the right lateral breast.  Psychiatric: She has a normal mood and affect.  Nursing note and vitals reviewed.    ED Treatments / Results  Labs (all labs ordered are listed, but only abnormal results are displayed) Labs Reviewed  CBC - Abnormal; Notable for the following:       Result Value   WBC 12.0 (*)    All other components within normal  limits  COMPREHENSIVE METABOLIC PANEL - Abnormal; Notable for the following:    Sodium 132 (*)    CO2 21 (*)    Glucose, Bld 448 (*)    Calcium 8.8 (*)    All other components within normal limits  URINALYSIS, ROUTINE W REFLEX MICROSCOPIC - Abnormal; Notable for the following:    APPearance HAZY (*)    Specific Gravity, Urine 1.033 (*)    Glucose, UA >=500 (*)    Squamous Epithelial / LPF 0-5 (*)    All other components within normal limits  I-STAT CG4 LACTIC ACID, ED - Abnormal; Notable for the following:    Lactic Acid, Venous 3.80 (*)    All other components within normal limits  I-STAT VENOUS BLOOD GAS, ED - Abnormal; Notable for the following:    pCO2, Ven 37.6 (*)    pO2, Ven 31.0 (*)    Acid-base deficit 3.0 (*)    All other components within normal limits  BLOOD GAS, VENOUS    EKG  EKG Interpretation None       Radiology US Breast Ltd Uni Right Inc Axilla  Result Date: 07/03/2016 CLINICAL DATA:  36 year old female with right breast abscess. The patient initially presented 06/24/2016 with right breast abscess. This was subsequently drained on the same day and patient returned for follow-up 06/30/2016 with worsening abscess formation. The patient's abscess again drained and she was placed on a course of ciprofloxacin. Since that time the patient states her symptoms have continued to worsen. She does not feel as if the current antibiotics are working. The patient states there is now a draining open wound. EXAM: ULTRASOUND OF THE RIGHT BREAST COMPARISON:  Previous exam(s). FINDINGS: Physical examination of the right breast reveals enlargement and firmness with marked erythema involving the lower outer right breast. There is a purulent drainage emanating from an open wound. The patient is exquisitely tender to palpation. Targeted ultrasound of the right breast was performed demonstrating a large complex collection compatible with an abscess at the 8 to 9 o'clock position 4-5 cm  from nipple. This measures at least 5.6 x 2.6 x 4.5 cm. Due to large size of the abscess, it could not be entirely included in the field of view. There adjacent skin thickening and edema with increased vascularity. IMPRESSION: Enlarging right breast abscess, despite antibiotics and two ultrasound-guided abscess aspirations. RECOMMENDATION: Ultrasound-guided abscess drainage is recommended. The patient is unsure if she can tolerate an additional abscess drainage due to extreme pain and an order from the patient's primary care provider for abscess drainage is still pending. Given the patient's worsening abscess referral to a breast surgeon is recommended. This is scheduled with [facility] Monday 07/06/2016 at 2:30 p.m. (earliest appointment available). The patient will be switched over to Bactrim as she feels her current antibiotic is not effective. Upon further speaking with the patient and the patient's mother, the patient will likely  go to the emergency department for pain management (pain medication is not provided at the [facility]) and further management of her right breast abscess rather than having an ultrasound-guided abscess drainage. If the patient does not go to the emergency department then she should follow-up for her scheduled appointment with [facility] surgery on Monday. I have discussed the findings and recommendations with the patient. Results were also provided in writing at the conclusion of the visit. If applicable, a reminder letter will be sent to the patient regarding the next appointment. BI-RADS CATEGORY  2: Benign. Electronically Signed   By: Everlean Alstrom M.D.   On: 07/03/2016 14:31    Procedures Procedures (including critical care time)  Medications Ordered in ED Medications  HYDROmorphone (DILAUDID) injection 0.25-0.5 mg (not administered)  insulin aspart (novoLOG) injection 6 Units (not administered)  sodium chloride 0.9 % bolus 1,000 mL (1,000 mLs  Intravenous New Bag/Given 07/03/16 1920)  morphine 4 MG/ML injection 4 mg (4 mg Intravenous Given 07/03/16 1921)     Initial Impression / Assessment and Plan / ED Course  I have reviewed the triage vital signs and the nursing notes.  Pertinent labs & imaging results that were available during my care of the patient were reviewed by me and considered in my medical decision making (see chart for details).    Patient with uncontrolled diabetes presents with worsening breast abscess. Discussed case with general surgery for likely admission. IV fluids, insulin, pain meds ordered. Awaiting antibiotics per surgery.  The patients results and plan were reviewed and discussed.   Any x-rays performed were independently reviewed by myself.   Differential diagnosis were considered with the presenting HPI.  Medications  HYDROmorphone (DILAUDID) injection 0.25-0.5 mg (not administered)  insulin aspart (novoLOG) injection 6 Units (not administered)  sodium chloride 0.9 % bolus 1,000 mL (1,000 mLs Intravenous New Bag/Given 07/03/16 1920)  morphine 4 MG/ML injection 4 mg (4 mg Intravenous Given 07/03/16 1921)    Vitals:   07/03/16 1452 07/03/16 1648 07/03/16 1900  BP: 129/84 126/83 123/71  Pulse: (!) 105 98 80  Resp: 18 18   Temp: 98.1 F (36.7 C)    TempSrc: Oral    SpO2: 98% 99% 99%    Final diagnoses:  Abscess of breast, right  Type 2 diabetes mellitus with hyperglycemia, without long-term current use of insulin ([place])    Admission/ observation were discussed with the admitting physician, patient and/or family and they are comfortable with the plan.    Final Clinical Impressions(s) / ED Diagnoses   Final diagnoses:  Abscess of breast, right  Type 2 diabetes mellitus with hyperglycemia, without long-term current use of insulin ([place])    New Prescriptions New Prescriptions   No medications on file     Danielle Morrison, MD 07/06/16 [phone]

## 2016-07-03 NOTE — Transfer of Care (Signed)
Immediate Anesthesia Transfer of Care Note  Patient: Danielle Peck  Procedure(s) Performed: Procedure(s): IRRIGATION AND DEBRIDEMENT WOUND (Right)  Patient Location: PACU  Anesthesia Type:General  Level of Consciousness: awake, alert  and oriented  Airway & Oxygen Therapy: Patient Spontanous Breathing and Patient connected to face mask oxygen  Post-op Assessment: Report given to RN and Post -op Vital signs reviewed and stable  Post vital signs: Reviewed and stable  Last Vitals:  Vitals:   07/03/16 1648 07/03/16 1900  BP: 126/83 123/71  Pulse: 98 80  Resp: 18   Temp:      Last Pain:  Vitals:   07/03/16 1917  TempSrc:   PainSc: 8          Complications: No apparent anesthesia complications

## 2016-07-03 NOTE — Anesthesia Postprocedure Evaluation (Signed)
Anesthesia Post Note  Patient: Danielle Peck  Procedure(s) Performed: Procedure(s) (LRB): IRRIGATION AND DEBRIDEMENT WOUND (Right)  Patient location during evaluation: PACU Anesthesia Type: General Level of consciousness: awake and alert Pain management: pain level controlled Vital Signs Assessment: post-procedure vital signs reviewed and stable Respiratory status: spontaneous breathing, nonlabored ventilation, respiratory function stable and patient connected to nasal cannula oxygen Cardiovascular status: blood pressure returned to baseline and stable Postop Assessment: no signs of nausea or vomiting Anesthetic complications: no       Last Vitals:  Vitals:   07/03/16 2145 07/03/16 2200  BP: 123/75 120/66  Pulse: 87   Resp:    Temp:  37.2 C    Last Pain:  Vitals:   07/03/16 2145  TempSrc:   PainSc: 8                  Tamaj Jurgens,W. EDMOND

## 2016-07-03 NOTE — ED Notes (Signed)
Dr Reather Converse given a copy of lactic acid and venous blood gas results

## 2016-07-03 NOTE — Consult Note (Signed)
[facility] Medical Consultation  Danielle Peck ZRA:[id number] DOB: 1980-06-25 DOA: 07/03/2016 PCP: No PCP Per Patient   Requesting physician: Dr. Rosendo Gros Date of consultation: 07/03/2016 Reason for consultation: Diabetes management  Impression/Recommendations   1. Sepsis 2/2 Right breast abscess: Patient presents with right breast abscess. WBC 12, heart rates up to 105, and lactic acid 3.8. Patient evaluated by Dr. Rosendo Gros and set to undergo I&D  - Per General surgery  2. Diabetes mellitus type 2 with hyperglycemia: Acute.  Initial blood sugar elevated at 448. Diabetes regimen consists of Victoza. Problem #1 likely elevating blood sugars.  - Hypoglycemic protocols  - Hold Victoza  - CBGs every 4 hours with Moderate SSI, and  changed to Sensitive SSI qAC and HS once  CBG <200.  - Adjust insulin regimen as needed  I will followup again tomorrow. Please contact me if I can be of assistance in the meanwhile. Thank you for this consultation.  Chief Complaint: Right breast pain  HPI:  Ms. Danielle Peck is 36 year old female with pmh diabetes mellitus type 2, psoriasis, asthma, and anxiety; who presents with complaints of a 2 week history of right breast pain secondary to abscess. Evaluated at the breast clinic for which she underwent needle aspirations and ultrasounds while being on antibiotics without relief of symptoms. Patient noted progressively worsening pain for which she came to the ER and was seen have a 5.6 x 2.6 x 4.5 cm right breast abscess. Dr. Rosendo Gros of general surgery admitted the patient and she was taken to surgery for I&D. Initial lab work revealed WBC 16.4, sodium 132, potassium 4, chloride 101, CO2 21, glucose 488, and anion gap 10. She was given 6 units of NovoLog prior to going to surgery. Patient also admits blood sugars at home have been elevated.   Review of Systems: As per HPI otherwise 10 point review of systems negative.    Past Medical History:  Diagnosis Date  .  Asthma   . Diabetes mellitus without complication ([place])   . HSV-2 infection   . Psoriasis    Past Surgical History:  Procedure Laterality Date  . APPENDECTOMY     Social History:  reports that she has never smoked. She has never used smokeless tobacco. She reports that she drinks alcohol. She reports that she does not use drugs.  No Known Allergies No family history on file.  Prior to Admission medications   Medication Sig Start Date End Date Taking? Authorizing Provider  albuterol (PROVENTIL HFA;VENTOLIN HFA) 108 (90 Base) MCG/ACT inhaler Inhale 2 puffs into the lungs every 6 (six) hours as needed for wheezing or shortness of breath. 02/14/16   Jaynee Eagles, PA-C  ALPRAZolam Duanne Moron) 0.5 MG tablet Take 0.5 mg by mouth at bedtime as needed for anxiety.    Historical Provider, MD  benzonatate (TESSALON) 100 MG capsule Take 1-2 capsules (100-200 mg total) by mouth 3 (three) times daily as needed for cough. 02/14/16   Jaynee Eagles, PA-C  HYDROcodone-homatropine [facility]) 5-1.5 MG/5ML syrup Take 5 mLs by mouth at bedtime as needed. 02/14/16   Jaynee Eagles, PA-C  liraglutide (VICTOZA) 18 MG/3ML SOPN Inject into the skin.    Historical Provider, MD  pseudoephedrine (SUDAFED 12 HOUR) 120 MG 12 hr tablet Take 1 tablet (120 mg total) by mouth 2 (two) times daily. 02/14/16   Jaynee Eagles, PA-C  sertraline (ZOLOFT) 25 MG tablet Take 25 mg by mouth daily.    Historical Provider, MD   Physical Exam: Blood pressure 123/71, pulse 80,  temperature 98.1 F (36.7 C), temperature source Oral, resp. rate 18, last menstrual period 07/01/2016, SpO2 99 %. Vitals:   07/03/16 1648 07/03/16 1900  BP: 126/83 123/71  Pulse: 98 80  Resp: 18   Temp:     Constitutional: NAD, calm, comfortable Eyes: PERRL, lids and conjunctivae normal ENMT: Mucous membranes are moist. Posterior pharynx clear of any exudate or lesions.Normal dentition.  Neck: normal, supple, no masses, no thyromegaly Respiratory: clear to auscultation  bilaterally, no wheezing, no crackles. Normal respiratory effort. No accessory muscle use.  Cardiovascular: Regular rate and rhythm, no murmurs / rubs / gallops. No extremity edema. 2+ pedal pulses. No carotid bruits.  Abdomen: no tenderness, no masses palpated. No hepatosplenomegaly. Bowel sounds positive.  Musculoskeletal: no clubbing / cyanosis. No joint deformity upper and lower extremities. Good ROM, no contractures. Normal muscle tone.  Skin:   Right breast surgrical wound dressed  Neurologic: CN 2-12 grossly intact. Sensation intact, DTR normal. Strength 5/5 in all 4.  Psychiatric: Normal judgment and insight. Alert and oriented x 3. Normal mood.   Labs on Admission:  Basic Metabolic Panel:  Recent Labs Lab 07/03/16 1454  NA 132*  K 4.0  CL 101  CO2 21*  GLUCOSE 448*  BUN 7  CREATININE 0.54  CALCIUM 8.8*   Liver Function Tests:  Recent Labs Lab 07/03/16 1454  AST 19  ALT 15  ALKPHOS 69  BILITOT 0.4  PROT 7.0  ALBUMIN 3.8   No results for input(s): LIPASE, AMYLASE in the last 168 hours. No results for input(s): AMMONIA in the last 168 hours. CBC:  Recent Labs Lab 07/03/16 1454  WBC 12.0*  HGB 14.0  HCT 40.2  MCV 91.4  PLT 308   Cardiac Enzymes: No results for input(s): CKTOTAL, CKMB, CKMBINDEX, TROPONINI in the last 168 hours. BNP: Invalid input(s): POCBNP CBG: No results for input(s): GLUCAP in the last 168 hours.  Radiological Exams on Admission: US Breast Ltd Uni Right Inc Axilla  Result Date: 07/03/2016 CLINICAL DATA:  36 year old female with right breast abscess. The patient initially presented 06/24/2016 with right breast abscess. This was subsequently drained on the same day and patient returned for follow-up 06/30/2016 with worsening abscess formation. The patient's abscess again drained and she was placed on a course of ciprofloxacin. Since that time the patient states her symptoms have continued to worsen. She does not feel as if the current  antibiotics are working. The patient states there is now a draining open wound. EXAM: ULTRASOUND OF THE RIGHT BREAST COMPARISON:  Previous exam(s). FINDINGS: Physical examination of the right breast reveals enlargement and firmness with marked erythema involving the lower outer right breast. There is a purulent drainage emanating from an open wound. The patient is exquisitely tender to palpation. Targeted ultrasound of the right breast was performed demonstrating a large complex collection compatible with an abscess at the 8 to 9 o'clock position 4-5 cm from nipple. This measures at least 5.6 x 2.6 x 4.5 cm. Due to large size of the abscess, it could not be entirely included in the field of view. There adjacent skin thickening and edema with increased vascularity. IMPRESSION: Enlarging right breast abscess, despite antibiotics and two ultrasound-guided abscess aspirations. RECOMMENDATION: Ultrasound-guided abscess drainage is recommended. The patient is unsure if she can tolerate an additional abscess drainage due to extreme pain and an order from the patient's primary care provider for abscess drainage is still pending. Given the patient's worsening abscess referral to a breast surgeon is recommended.  This is scheduled with [place] surgery Monday 07/06/2016 at 2:30 p.m. (earliest appointment available). The patient will be switched over to Bactrim as she feels her current antibiotic is not effective. Upon further speaking with the patient and the patient's mother, the patient will likely go to the emergency department for pain management (pain medication is not provided at the [facility]) and further management of her right breast abscess rather than having an ultrasound-guided abscess drainage. If the patient does not go to the emergency department then she should follow-up for her scheduled appointment with [facility] surgery on Monday. I have discussed the findings and recommendations with the  patient. Results were also provided in writing at the conclusion of the visit. If applicable, a reminder letter will be sent to the patient regarding the next appointment. BI-RADS CATEGORY  2: Benign. Electronically Signed   By: Everlean Alstrom M.D.   On: 07/03/2016 14:31     Time spent: >45 minutes  [facility] Pager [phone]  If 7PM-7AM, please contact night-coverage [email or web address] Password [email or web address] 07/03/2016, 9:05 PM

## 2016-07-03 NOTE — Anesthesia Procedure Notes (Signed)
Procedure Name: Intubation Date/Time: 07/03/2016 9:02 PM Performed by: Narcissus Detwiler S Pre-anesthesia Checklist: Patient identified, Emergency Drugs available, Suction available, Patient being monitored and Timeout performed Patient Re-evaluated:Patient Re-evaluated prior to inductionOxygen Delivery Method: Circle system utilized Preoxygenation: Pre-oxygenation with 100% oxygen Intubation Type: IV induction, Rapid sequence and Cricoid Pressure applied Laryngoscope Size: Mac and 3 Grade View: Grade I Tube type: Oral Tube size: 7.5 mm Number of attempts: 1 Airway Equipment and Method: Stylet Placement Confirmation: ETT inserted through vocal cords under direct vision,  positive ETCO2 and breath sounds checked- equal and bilateral Secured at: 22 cm Tube secured with: Tape Dental Injury: Teeth and Oropharynx as per pre-operative assessment

## 2016-07-03 NOTE — ED Notes (Signed)
In at bedside with Dr Rosendo Gros.  Patient being prepared to go to OR.

## 2016-07-03 NOTE — ED Triage Notes (Signed)
Pt reports being seen at the breast center for R breast redness & swelling, pt sent here for an I&D, pt has large Red hard area to R breast peri areola, pt afebrile, A&O x4

## 2016-07-03 NOTE — Progress Notes (Signed)
Pharmacy Antibiotic Note  Danielle Peck is a 36 y.o. female admitted on 07/03/2016 with infected breast.  Pharmacy has been consulted for zosyn and vanc dosing.  Plan: vanc 1 g q8h Zosyn 3.375 gm iv q8h  Weight: 176 lb 5.9 oz (80 kg)  Temp (24hrs), Avg:98.3 F (36.8 C), Min:98.1 F (36.7 C), Max:98.4 F (36.9 C)   Recent Labs Lab 07/03/16 1454 07/03/16 1943  WBC 12.0*  --   CREATININE 0.54  --   LATICACIDVEN  --  3.80*    Estimated Creatinine Clearance: 100.1 mL/min (by C-G formula based on SCr of 0.54 mg/dL).    No Known Allergies  Levester Fresh, PharmD, BCPS, BCCCP Clinical Pharmacist 07/03/2016 9:51 PM

## 2016-07-04 ENCOUNTER — Encounter (HOSPITAL_COMMUNITY): Payer: Self-pay | Admitting: General Surgery

## 2016-07-04 DIAGNOSIS — N611 Abscess of the breast and nipple: Secondary | ICD-10-CM

## 2016-07-04 DIAGNOSIS — E1165 Type 2 diabetes mellitus with hyperglycemia: Secondary | ICD-10-CM

## 2016-07-04 LAB — CBC
HCT: 37.5 % (ref 36.0–46.0)
Hemoglobin: 13 g/dL (ref 12.0–15.0)
MCH: 32.1 pg (ref 26.0–34.0)
MCHC: 34.7 g/dL (ref 30.0–36.0)
MCV: 92.6 fL (ref 78.0–100.0)
PLATELETS: 292 10*3/uL (ref 150–400)
RBC: 4.05 MIL/uL (ref 3.87–5.11)
RDW: 12.2 % (ref 11.5–15.5)
WBC: 10.2 10*3/uL (ref 4.0–10.5)

## 2016-07-04 LAB — BASIC METABOLIC PANEL
Anion gap: 9 (ref 5–15)
BUN: 6 mg/dL (ref 6–20)
CO2: 24 mmol/L (ref 22–32)
CREATININE: 0.42 mg/dL — AB (ref 0.44–1.00)
Calcium: 8.5 mg/dL — ABNORMAL LOW (ref 8.9–10.3)
Chloride: 103 mmol/L (ref 101–111)
Glucose, Bld: 289 mg/dL — ABNORMAL HIGH (ref 65–99)
POTASSIUM: 3.5 mmol/L (ref 3.5–5.1)
SODIUM: 136 mmol/L (ref 135–145)

## 2016-07-04 LAB — HIV ANTIBODY (ROUTINE TESTING W REFLEX): HIV SCREEN 4TH GENERATION: NONREACTIVE

## 2016-07-04 LAB — GLUCOSE, CAPILLARY
GLUCOSE-CAPILLARY: 144 mg/dL — AB (ref 65–99)
GLUCOSE-CAPILLARY: 329 mg/dL — AB (ref 65–99)
GLUCOSE-CAPILLARY: 206 mg/dL — AB (ref 65–99)
Glucose-Capillary: 321 mg/dL — ABNORMAL HIGH (ref 65–99)

## 2016-07-04 MED ORDER — INSULIN ASPART 100 UNIT/ML ~~LOC~~ SOLN
0.0000 [IU] | Freq: Three times a day (TID) | SUBCUTANEOUS | Status: DC
  Administered 2016-07-04 (×2): 7 [IU] via SUBCUTANEOUS
  Administered 2016-07-05: 5 [IU] via SUBCUTANEOUS
  Administered 2016-07-05: 2 [IU] via SUBCUTANEOUS
  Administered 2016-07-05: 3 [IU] via SUBCUTANEOUS
  Administered 2016-07-06: 2 [IU] via SUBCUTANEOUS
  Administered 2016-07-06: 1 [IU] via SUBCUTANEOUS
  Administered 2016-07-06: 2 [IU] via SUBCUTANEOUS

## 2016-07-04 MED ORDER — INSULIN GLARGINE 100 UNIT/ML ~~LOC~~ SOLN
15.0000 [IU] | Freq: Every day | SUBCUTANEOUS | Status: DC
  Administered 2016-07-04: 15 [IU] via SUBCUTANEOUS
  Filled 2016-07-04 (×2): qty 0.15

## 2016-07-04 MED ORDER — INSULIN ASPART 100 UNIT/ML ~~LOC~~ SOLN
0.0000 [IU] | Freq: Every day | SUBCUTANEOUS | Status: DC
  Administered 2016-07-05: 2 [IU] via SUBCUTANEOUS

## 2016-07-04 MED ORDER — POLYETHYLENE GLYCOL 3350 17 G PO PACK
17.0000 g | PACK | Freq: Every day | ORAL | Status: DC | PRN
  Administered 2016-07-04: 17 g via ORAL
  Filled 2016-07-04: qty 1

## 2016-07-04 MED ORDER — DOCUSATE SODIUM 100 MG PO CAPS
100.0000 mg | ORAL_CAPSULE | Freq: Two times a day (BID) | ORAL | Status: DC
  Administered 2016-07-04 – 2016-07-06 (×4): 100 mg via ORAL
  Filled 2016-07-04 (×4): qty 1

## 2016-07-04 MED ORDER — INSULIN ASPART 100 UNIT/ML ~~LOC~~ SOLN
5.0000 [IU] | Freq: Three times a day (TID) | SUBCUTANEOUS | Status: DC
  Administered 2016-07-04: 5 [IU] via SUBCUTANEOUS

## 2016-07-04 NOTE — Progress Notes (Signed)
1 Day Post-Op  Subjective: Sore, but feels better than pre-op Waiting on breakfast  Objective: Vital signs in last 24 hours: Temp:  [97.6 F (36.4 C)-99 F (37.2 C)] 98.5 F (36.9 C) (03/17 0958) Pulse Rate:  [73-105] 79 (03/17 0958) Resp:  [18-19] 19 (03/17 0958) BP: (105-129)/(61-84) 113/65 (03/17 0958) SpO2:  [95 %-99 %] 97 % (03/17 0958) Weight:  [80 kg (176 lb 5.9 oz)] 80 kg (176 lb 5.9 oz) (03/16 2125) Last BM Date: 07/03/16  Intake/Output from previous day: 03/16 0701 - 03/17 0700 In: 1991.7 [I.V.:1691.7; IV Piggyback:300] Out: 1055 [Urine:1050; Blood:5] Intake/Output this shift: No intake/output data recorded.  General appearance: alert, cooperative and no distress Incision/Wound:  Skin still with some mild erythema Packing removed; no bleeding; some drainage; no obvious necrotic tissue Repacked with 4x4 gauze  Lab Results:   Recent Labs  07/03/16 1454 07/04/16 0719  WBC 12.0* 10.2  HGB 14.0 13.0  HCT 40.2 37.5  PLT 308 292   BMET  Recent Labs  07/03/16 1454 07/04/16 0719  NA 132* 136  K 4.0 3.5  CL 101 103  CO2 21* 24  GLUCOSE 448* 289*  BUN 7 6  CREATININE 0.54 0.42*  CALCIUM 8.8* 8.5*   PT/INR No results for input(s): LABPROT, INR in the last 72 hours. ABG  Recent Labs  07/03/16 1941  HCO3 22.0    Studies/Results: US Breast Ltd Uni Right Inc Axilla  Result Date: 07/03/2016 CLINICAL DATA:  36 year old female with right breast abscess. The patient initially presented 06/24/2016 with right breast abscess. This was subsequently drained on the same day and patient returned for follow-up 06/30/2016 with worsening abscess formation. The patient's abscess again drained and she was placed on a course of ciprofloxacin. Since that time the patient states her symptoms have continued to worsen. She does not feel as if the current antibiotics are working. The patient states there is now a draining open wound. EXAM: ULTRASOUND OF THE RIGHT BREAST  COMPARISON:  Previous exam(s). FINDINGS: Physical examination of the right breast reveals enlargement and firmness with marked erythema involving the lower outer right breast. There is a purulent drainage emanating from an open wound. The patient is exquisitely tender to palpation. Targeted ultrasound of the right breast was performed demonstrating a large complex collection compatible with an abscess at the 8 to 9 o'clock position 4-5 cm from nipple. This measures at least 5.6 x 2.6 x 4.5 cm. Due to large size of the abscess, it could not be entirely included in the field of view. There adjacent skin thickening and edema with increased vascularity. IMPRESSION: Enlarging right breast abscess, despite antibiotics and two ultrasound-guided abscess aspirations. RECOMMENDATION: Ultrasound-guided abscess drainage is recommended. The patient is unsure if she can tolerate an additional abscess drainage due to extreme pain and an order from the patient's primary care provider for abscess drainage is still pending. Given the patient's worsening abscess referral to a breast surgeon is recommended. This is scheduled with [place] surgery Monday 07/06/2016 at 2:30 p.m. (earliest appointment available). The patient will be switched over to Bactrim as she feels her current antibiotic is not effective. Upon further speaking with the patient and the patient's mother, the patient will likely go to the emergency department for pain management (pain medication is not provided at the [facility]) and further management of her right breast abscess rather than having an ultrasound-guided abscess drainage. If the patient does not go to the emergency department then she should follow-up for  her scheduled appointment with [facility] surgery on Monday. I have discussed the findings and recommendations with the patient. Results were also provided in writing at the conclusion of the visit. If applicable, a reminder letter will  be sent to the patient regarding the next appointment. BI-RADS CATEGORY  2: Benign. Electronically Signed   By: Everlean Alstrom M.D.   On: 07/03/2016 14:31    Anti-infectives: Anti-infectives    Start     Dose/Rate Route Frequency Ordered Stop   07/04/16 0600  vancomycin (VANCOCIN) IVPB 1000 mg/200 mL premix     1,000 mg 200 mL/hr over 60 Minutes Intravenous Every 8 hours 07/03/16 2150     07/04/16 0600  piperacillin-tazobactam (ZOSYN) IVPB 3.375 g     3.375 g 12.5 mL/hr over 240 Minutes Intravenous Every 8 hours 07/03/16 2150     07/04/16 0000  piperacillin-tazobactam (ZOSYN) IVPB 3.375 g     3.375 g 12.5 mL/hr over 240 Minutes Intravenous Every 6 hours 07/03/16 2152 07/04/16 0513   07/03/16 2035  vancomycin (VANCOCIN) 1-5 GM/200ML-% IVPB    Comments:  Haynes Bast   : cabinet override      07/03/16 2035 07/03/16 2130      Assessment/Plan: Large right breast abscess s/p incision and drainage by Dr. Rosendo Gros 3/16 BID dressing changes - will eventually need HHN for daily wet to dry dressings at home Will probably be here until Monday.   LOS: 1 day    Jennica Tagliaferri K. 07/04/2016

## 2016-07-04 NOTE — Progress Notes (Signed)
PROGRESS NOTE    Danielle Peck  EUM:[id number] DOB: Sep 03, 1980 DOA: 07/03/2016 PCP: No PCP Per Patient    Brief Narrative:  36 year old female with pmh diabetes mellitus type 2, psoriasis, asthma, and anxiety; who presents with complaints of a 2 week history of right breast pain secondary to abscess. Evaluated at the breast clinic for which she underwent needle aspirations and ultrasounds while being on antibiotics without relief of symptoms. Patient noted progressively worsening pain for which she came to the ER and was seen have a 5.6 x 2.6 x 4.5 cm right breast abscess. Dr. Rosendo Gros of general surgery admitted the patient and she was taken to surgery for I&D. Initial lab work revealed WBC 16.4, sodium 132, potassium 4, chloride 101, CO2 21, glucose 488, and anion gap 10. She was given 6 units of NovoLog prior to going to surgery. Patient also admits blood sugars at home have been elevated.   Assessment & Plan:   Active Problems:   Breast abscess   1. Sepsis 2/2 Right breast abscess: Patient presented with right breast abscess. WBC 12, heart rates up to 105, and lactic acid 3.8. Patient evaluated by Dr. Rosendo Gros and pt has undergone I&D             - Continue per General surgery  2.    Diabetes mellitus type 2 with hyperglycemia: Acute.  Initial blood sugar elevated at 448. Diabetes regimen consists of Victoza.              - Continue to hold Victoza - Continue with SSI coverage - glucose remains in the upper 200-300's - Will start patient on lantus 15 units with 5 units aspart before meals - Repeat bmet in AM  DVT prophylaxis: Lovenox subQ Code Status: Full Family Communication: Pt in room, family not at bedside Disposition Plan: Uncertain at this time  Consultants:     Procedures:   Irrigation and debridement of R breast abscess  Antimicrobials: Anti-infectives    Start     Dose/Rate Route Frequency Ordered Stop   07/04/16 0600  vancomycin (VANCOCIN) IVPB 1000 mg/200 mL  premix     1,000 mg 200 mL/hr over 60 Minutes Intravenous Every 8 hours 07/03/16 2150     07/04/16 0600  piperacillin-tazobactam (ZOSYN) IVPB 3.375 g     3.375 g 12.5 mL/hr over 240 Minutes Intravenous Every 8 hours 07/03/16 2150     07/04/16 0000  piperacillin-tazobactam (ZOSYN) IVPB 3.375 g     3.375 g 12.5 mL/hr over 240 Minutes Intravenous Every 6 hours 07/03/16 2152 07/04/16 0513   07/03/16 2035  vancomycin (VANCOCIN) 1-5 GM/200ML-% IVPB    Comments:  Mcphail, Nancy   : cabinet override      07/03/16 2035 07/03/16 2130       Subjective: No complaints  Objective: Vitals:   07/03/16 2342 07/04/16 0341 07/04/16 0958 07/04/16 1337  BP: 120/61 107/62 113/65 110/73  Pulse: 88 73 79 79  Resp: 19 19 19 18   Temp: 98.2 F (36.8 C) 97.6 F (36.4 C) 98.5 F (36.9 C) 98.1 F (36.7 C)  TempSrc: Oral Oral Oral Oral  SpO2: 96% 95% 97% 97%  Weight:        Intake/Output Summary (Last 24 hours) at 07/04/16 1610 Last data filed at 07/04/16 0525  Gross per 24 hour  Intake          1991.66 ml  Output             1055 ml  Net           936.66 ml   Filed Weights   07/03/16 2125  Weight: 80 kg (176 lb 5.9 oz)    Examination:  General exam: Appears calm and comfortable  Respiratory system: Clear to auscultation. Respiratory effort normal. Cardiovascular system: S1 & S2 heard, RRR. Gastrointestinal system: Abdomen is nondistended, soft and nontender. No organomegaly or masses felt. Normal bowel sounds heard. Central nervous system: Alert and oriented. No focal neurological deficits. Extremities: Symmetric 5 x 5 power. Skin: No rashes, lesions Psychiatry: Judgement and insight appear normal. Mood & affect appropriate.   Data Reviewed: I have personally reviewed following labs and imaging studies  CBC:  Recent Labs Lab 07/03/16 1454 07/04/16 0719  WBC 12.0* 10.2  HGB 14.0 13.0  HCT 40.2 37.5  MCV 91.4 92.6  PLT 308 892   Basic Metabolic Panel:  Recent Labs Lab  07/03/16 1454 07/04/16 0719  NA 132* 136  K 4.0 3.5  CL 101 103  CO2 21* 24  GLUCOSE 448* 289*  BUN 7 6  CREATININE 0.54 0.42*  CALCIUM 8.8* 8.5*   GFR: Estimated Creatinine Clearance: 100.1 mL/min (A) (by C-G formula based on SCr of 0.42 mg/dL (L)). Liver Function Tests:  Recent Labs Lab 07/03/16 1454  AST 19  ALT 15  ALKPHOS 69  BILITOT 0.4  PROT 7.0  ALBUMIN 3.8   No results for input(s): LIPASE, AMYLASE in the last 168 hours. No results for input(s): AMMONIA in the last 168 hours. Coagulation Profile: No results for input(s): INR, PROTIME in the last 168 hours. Cardiac Enzymes: No results for input(s): CKTOTAL, CKMB, CKMBINDEX, TROPONINI in the last 168 hours. BNP (last 3 results) No results for input(s): PROBNP in the last 8760 hours. HbA1C: No results for input(s): HGBA1C in the last 72 hours. CBG:  Recent Labs Lab 07/03/16 2131 07/03/16 2223 07/03/16 2339 07/04/16 0337 07/04/16 1133  GLUCAP 338* 282* 273* 144* 329*   Lipid Profile: No results for input(s): CHOL, HDL, LDLCALC, TRIG, CHOLHDL, LDLDIRECT in the last 72 hours. Thyroid Function Tests: No results for input(s): TSH, T4TOTAL, FREET4, T3FREE, THYROIDAB in the last 72 hours. Anemia Panel: No results for input(s): VITAMINB12, FOLATE, FERRITIN, TIBC, IRON, RETICCTPCT in the last 72 hours. Sepsis Labs:  Recent Labs Lab 07/03/16 1943  LATICACIDVEN 3.80*    Recent Results (from the past 240 hour(s))  Aerobic/Anaerobic Culture (surgical/deep wound)     Status: None (Preliminary result)   Collection Time: 07/03/16  9:13 PM  Result Value Ref Range Status   Specimen Description ABSCESS RIGHT BREAST  Final   Special Requests PATIENT ON FOLLOWING CIPRO  Final   Gram Stain   Final    ABUNDANT WBC PRESENT, PREDOMINANTLY PMN ABUNDANT GRAM POSITIVE COCCI IN CHAINS    Culture CULTURE REINCUBATED FOR BETTER GROWTH  Final   Report Status PENDING  Incomplete     Radiology Studies: US Breast Ltd  Uni Right Inc Axilla  Result Date: 07/03/2016 CLINICAL DATA:  36 year old female with right breast abscess. The patient initially presented 06/24/2016 with right breast abscess. This was subsequently drained on the same day and patient returned for follow-up 06/30/2016 with worsening abscess formation. The patient's abscess again drained and she was placed on a course of ciprofloxacin. Since that time the patient states her symptoms have continued to worsen. She does not feel as if the current antibiotics are working. The patient states there is now a draining open wound. EXAM: ULTRASOUND OF THE  RIGHT BREAST COMPARISON:  Previous exam(s). FINDINGS: Physical examination of the right breast reveals enlargement and firmness with marked erythema involving the lower outer right breast. There is a purulent drainage emanating from an open wound. The patient is exquisitely tender to palpation. Targeted ultrasound of the right breast was performed demonstrating a large complex collection compatible with an abscess at the 8 to 9 o'clock position 4-5 cm from nipple. This measures at least 5.6 x 2.6 x 4.5 cm. Due to large size of the abscess, it could not be entirely included in the field of view. There adjacent skin thickening and edema with increased vascularity. IMPRESSION: Enlarging right breast abscess, despite antibiotics and two ultrasound-guided abscess aspirations. RECOMMENDATION: Ultrasound-guided abscess drainage is recommended. The patient is unsure if she can tolerate an additional abscess drainage due to extreme pain and an order from the patient's primary care provider for abscess drainage is still pending. Given the patient's worsening abscess referral to a breast surgeon is recommended. This is scheduled with [place] surgery Monday 07/06/2016 at 2:30 p.m. (earliest appointment available). The patient will be switched over to Bactrim as she feels her current antibiotic is not effective. Upon further  speaking with the patient and the patient's mother, the patient will likely go to the emergency department for pain management (pain medication is not provided at the [facility]) and further management of her right breast abscess rather than having an ultrasound-guided abscess drainage. If the patient does not go to the emergency department then she should follow-up for her scheduled appointment with [facility] surgery on Monday. I have discussed the findings and recommendations with the patient. Results were also provided in writing at the conclusion of the visit. If applicable, a reminder letter will be sent to the patient regarding the next appointment. BI-RADS CATEGORY  2: Benign. Electronically Signed   By: Everlean Alstrom M.D.   On: 07/03/2016 14:31    Scheduled Meds: . docusate sodium  100 mg Oral BID  . enoxaparin (LOVENOX) injection  40 mg Subcutaneous Q24H  . insulin aspart  0-5 Units Subcutaneous QHS  . insulin aspart  0-9 Units Subcutaneous TID WC  . insulin aspart  5 Units Subcutaneous TID WC  . insulin glargine  15 Units Subcutaneous Daily  . piperacillin-tazobactam (ZOSYN)  IV  3.375 g Intravenous Q8H  . sertraline  25 mg Oral Daily  . vancomycin  1,000 mg Intravenous Q8H   Continuous Infusions: . sodium chloride 125 mL/hr at 07/04/16 1218     LOS: 1 day   CHIU, Orpah Melter, MD [facility] Pager [phone]  If 7PM-7AM, please contact night-coverage [email or web address] Password [email or web address] 07/04/2016, 4:10 PM

## 2016-07-05 LAB — VANCOMYCIN, TROUGH: Vancomycin Tr: 8 ug/mL — ABNORMAL LOW (ref 15–20)

## 2016-07-05 LAB — BASIC METABOLIC PANEL
Anion gap: 6 (ref 5–15)
BUN: 7 mg/dL (ref 6–20)
CHLORIDE: 105 mmol/L (ref 101–111)
CO2: 25 mmol/L (ref 22–32)
Calcium: 8.4 mg/dL — ABNORMAL LOW (ref 8.9–10.3)
Creatinine, Ser: 0.49 mg/dL (ref 0.44–1.00)
GFR calc non Af Amer: >60 mL/min (ref >60)
Glucose, Bld: 286 mg/dL — ABNORMAL HIGH (ref 65–99)
POTASSIUM: 3.5 mmol/L (ref 3.5–5.1)
SODIUM: 136 mmol/L (ref 135–145)

## 2016-07-05 LAB — GLUCOSE, CAPILLARY
GLUCOSE-CAPILLARY: 277 mg/dL — AB (ref 65–99)
GLUCOSE-CAPILLARY: 247 mg/dL — AB (ref 65–99)
GLUCOSE-CAPILLARY: 202 mg/dL — AB (ref 65–99)
Glucose-Capillary: 182 mg/dL — ABNORMAL HIGH (ref 65–99)

## 2016-07-05 LAB — HEMOGLOBIN A1C
Hgb A1c MFr Bld: 10.9 % — ABNORMAL HIGH (ref 4.8–5.6)
Mean Plasma Glucose: 266 mg/dL

## 2016-07-05 MED ORDER — INSULIN GLARGINE 100 UNIT/ML ~~LOC~~ SOLN
22.0000 [IU] | Freq: Every day | SUBCUTANEOUS | Status: DC
  Administered 2016-07-05: 22 [IU] via SUBCUTANEOUS
  Filled 2016-07-05 (×2): qty 0.22

## 2016-07-05 MED ORDER — SODIUM CHLORIDE 0.9 % IV SOLN
3.0000 g | Freq: Four times a day (QID) | INTRAVENOUS | Status: DC
  Administered 2016-07-05 – 2016-07-06 (×5): 3 g via INTRAVENOUS
  Filled 2016-07-05 (×7): qty 3

## 2016-07-05 MED ORDER — INSULIN ASPART 100 UNIT/ML ~~LOC~~ SOLN
8.0000 [IU] | Freq: Three times a day (TID) | SUBCUTANEOUS | Status: DC
  Administered 2016-07-05 – 2016-07-06 (×5): 8 [IU] via SUBCUTANEOUS

## 2016-07-05 NOTE — Progress Notes (Signed)
2 Days Post-Op  Subjective: Still with significant tenderness in right breast  Objective: Vital signs in last 24 hours: Temp:  [97.9 F (36.6 C)-98.4 F (36.9 C)] 98.4 F (36.9 C) (03/18 0553) Pulse Rate:  [74-79] 74 (03/18 0553) Resp:  [17-18] 17 (03/18 0553) BP: (110-117)/(59-77) 111/59 (03/18 0553) SpO2:  [97 %-99 %] 98 % (03/18 0553) Last BM Date: 07/03/16  Intake/Output from previous day: 03/17 0701 - 03/18 0700 In: 3766.3 [P.O.:360; I.V.:2906.3; IV Piggyback:500] Out: 800 [Urine:800] Intake/Output this shift: Total I/O In: 400 [P.O.:400] Out: 1000 [Urine:1000]  General appearance: alert, cooperative and no distress Right breast still with cellulitis/ erythema lower part of breast Wound - beginning to granulate; some purulent drainage  Lab Results:   Recent Labs  07/03/16 1454 07/04/16 0719  WBC 12.0* 10.2  HGB 14.0 13.0  HCT 40.2 37.5  PLT 308 292   BMET  Recent Labs  07/04/16 0719 07/05/16 0534  NA 136 136  K 3.5 3.5  CL 103 105  CO2 24 25  GLUCOSE 289* 286*  BUN 6 7  CREATININE 0.42* 0.49  CALCIUM 8.5* 8.4*   PT/INR No results for input(s): LABPROT, INR in the last 72 hours. ABG  Recent Labs  07/03/16 1941  HCO3 22.0    Studies/Results: US Breast Ltd Uni Right Inc Axilla  Result Date: 07/03/2016 CLINICAL DATA:  36 year old female with right breast abscess. The patient initially presented 06/24/2016 with right breast abscess. This was subsequently drained on the same day and patient returned for follow-up 06/30/2016 with worsening abscess formation. The patient's abscess again drained and she was placed on a course of ciprofloxacin. Since that time the patient states her symptoms have continued to worsen. She does not feel as if the current antibiotics are working. The patient states there is now a draining open wound. EXAM: ULTRASOUND OF THE RIGHT BREAST COMPARISON:  Previous exam(s). FINDINGS: Physical examination of the right breast  reveals enlargement and firmness with marked erythema involving the lower outer right breast. There is a purulent drainage emanating from an open wound. The patient is exquisitely tender to palpation. Targeted ultrasound of the right breast was performed demonstrating a large complex collection compatible with an abscess at the 8 to 9 o'clock position 4-5 cm from nipple. This measures at least 5.6 x 2.6 x 4.5 cm. Due to large size of the abscess, it could not be entirely included in the field of view. There adjacent skin thickening and edema with increased vascularity. IMPRESSION: Enlarging right breast abscess, despite antibiotics and two ultrasound-guided abscess aspirations. RECOMMENDATION: Ultrasound-guided abscess drainage is recommended. The patient is unsure if she can tolerate an additional abscess drainage due to extreme pain and an order from the patient's primary care provider for abscess drainage is still pending. Given the patient's worsening abscess referral to a breast surgeon is recommended. This is scheduled with [place] surgery Monday 07/06/2016 at 2:30 p.m. (earliest appointment available). The patient will be switched over to Bactrim as she feels her current antibiotic is not effective. Upon further speaking with the patient and the patient's mother, the patient will likely go to the emergency department for pain management (pain medication is not provided at the [facility]) and further management of her right breast abscess rather than having an ultrasound-guided abscess drainage. If the patient does not go to the emergency department then she should follow-up for her scheduled appointment with [facility] surgery on Monday. I have discussed the findings and recommendations with the  patient. Results were also provided in writing at the conclusion of the visit. If applicable, a reminder letter will be sent to the patient regarding the next appointment. BI-RADS CATEGORY  2:  Benign. Electronically Signed   By: Everlean Alstrom M.D.   On: 07/03/2016 14:31    Anti-infectives: Anti-infectives    Start     Dose/Rate Route Frequency Ordered Stop   07/04/16 0600  vancomycin (VANCOCIN) IVPB 1000 mg/200 mL premix     1,000 mg 200 mL/hr over 60 Minutes Intravenous Every 8 hours 07/03/16 2150     07/04/16 0600  piperacillin-tazobactam (ZOSYN) IVPB 3.375 g     3.375 g 12.5 mL/hr over 240 Minutes Intravenous Every 8 hours 07/03/16 2150     07/04/16 0000  piperacillin-tazobactam (ZOSYN) IVPB 3.375 g     3.375 g 12.5 mL/hr over 240 Minutes Intravenous Every 6 hours 07/03/16 2152 07/04/16 0513   07/03/16 2035  vancomycin (VANCOCIN) 1-5 GM/200ML-% IVPB    Comments:  Haynes Bast   : cabinet override      07/03/16 2035 07/03/16 2130      Assessment/Plan: Large right breast abscess s/p incision and drainage by Dr. Rosendo Gros 3/16 BID dressing changes - will eventually need HHN for daily wet to dry dressings at home .  LOS: 2 days    Jaikob Borgwardt K. 07/05/2016

## 2016-07-05 NOTE — Progress Notes (Signed)
PROGRESS NOTE    Danielle Peck  EQA:[id number] DOB: 12/20/1980 DOA: 07/03/2016 PCP: No PCP Per Patient    Brief Narrative:  36 year old female with pmh diabetes mellitus type 2, psoriasis, asthma, and anxiety; who presents with complaints of a 2 week history of right breast pain secondary to abscess. Evaluated at the breast clinic for which she underwent needle aspirations and ultrasounds while being on antibiotics without relief of symptoms. Patient noted progressively worsening pain for which she came to the ER and was seen have a 5.6 x 2.6 x 4.5 cm right breast abscess. Dr. Rosendo Gros of general surgery admitted the patient and she was taken to surgery for I&D. Initial lab work revealed WBC 16.4, sodium 132, potassium 4, chloride 101, CO2 21, glucose 488, and anion gap 10. She was given 6 units of NovoLog prior to going to surgery. Patient also admits blood sugars at home have been elevated.   Assessment & Plan:   Active Problems:   Breast abscess   1. Sepsis 2/2 Right breast abscess: Patient presented with right breast abscess. WBC 12, heart rates up to 105, and lactic acid 3.8. Patient evaluated by Dr. Rosendo Gros and pt has undergone I&D             - Continue per General surgery  2.    Diabetes mellitus type 2 with hyperglycemia: Acute.  Initial blood sugar elevated at 448. Diabetes regimen consists of Victoza.              - Continue to hold Victoza - Continue with SSI coverage - glucose remains in the mid-200's - Increase lantus to 22 units and aspart to 8 units before meals - Will repeat bmet in AM  DVT prophylaxis: Lovenox subQ Code Status: Full Family Communication: Pt in room, family not at bedside Disposition Plan: Uncertain at this time  Consultants:     Procedures:   Irrigation and debridement of R breast abscess  Antimicrobials: Anti-infectives    Start     Dose/Rate Route Frequency Ordered Stop   07/05/16 1430  Ampicillin-Sulbactam (UNASYN) 3 g in sodium chloride  0.9 % 100 mL IVPB     3 g 200 mL/hr over 30 Minutes Intravenous Every 6 hours 07/05/16 1418     07/04/16 0600  vancomycin (VANCOCIN) IVPB 1000 mg/200 mL premix  Status:  Discontinued     1,000 mg 200 mL/hr over 60 Minutes Intravenous Every 8 hours 07/03/16 2150 07/05/16 1418   07/04/16 0600  piperacillin-tazobactam (ZOSYN) IVPB 3.375 g  Status:  Discontinued     3.375 g 12.5 mL/hr over 240 Minutes Intravenous Every 8 hours 07/03/16 2150 07/05/16 1418   07/04/16 0000  piperacillin-tazobactam (ZOSYN) IVPB 3.375 g     3.375 g 12.5 mL/hr over 240 Minutes Intravenous Every 6 hours 07/03/16 2152 07/04/16 0513   07/03/16 2035  vancomycin (VANCOCIN) 1-5 GM/200ML-% IVPB    Comments:  Haynes Bast   : cabinet override      07/03/16 2035 07/03/16 2130      Subjective: Without complaints  Objective: Vitals:   07/04/16 1337 07/04/16 2042 07/05/16 0553 07/05/16 1350  BP: 110/73 117/77 (!) 111/59 103/67  Pulse: 79 76 74 76  Resp: 18 18 17 17   Temp: 98.1 F (36.7 C) 97.9 F (36.6 C) 98.4 F (36.9 C) 98.6 F (37 C)  TempSrc: Oral Oral Oral Oral  SpO2: 97% 99% 98% 98%  Weight:        Intake/Output Summary (Last 24 hours) at  07/05/16 1758 Last data filed at 07/05/16 1700  Gross per 24 hour  Intake          6447.92 ml  Output             2800 ml  Net          3647.92 ml   Filed Weights   07/03/16 2125  Weight: 80 kg (176 lb 5.9 oz)    Examination:  General exam: Laying in bed, Awake, in nad Respiratory system: Clear to auscultation. Respiratory effort normal. Cardiovascular system: S1 & S2 heard, RRR. Gastrointestinal system: Abdomen is nondistended, soft and nontender. No organomegaly or masses felt. Normal bowel sounds heard. Central nervous system: Alert and oriented. No focal neurological deficits. Extremities: Symmetric 5 x 5 power. Skin: No rashes, lesions Psychiatry: Judgement and insight appear normal. Mood & affect appropriate.   Data Reviewed: I have personally  reviewed following labs and imaging studies  CBC:  Recent Labs Lab 07/03/16 1454 07/04/16 0719  WBC 12.0* 10.2  HGB 14.0 13.0  HCT 40.2 37.5  MCV 91.4 92.6  PLT 308 992   Basic Metabolic Panel:  Recent Labs Lab 07/03/16 1454 07/04/16 0719 07/05/16 0534  NA 132* 136 136  K 4.0 3.5 3.5  CL 101 103 105  CO2 21* 24 25  GLUCOSE 448* 289* 286*  BUN 7 6 7   CREATININE 0.54 0.42* 0.49  CALCIUM 8.8* 8.5* 8.4*   GFR: Estimated Creatinine Clearance: 100.1 mL/min (by C-G formula based on SCr of 0.49 mg/dL). Liver Function Tests:  Recent Labs Lab 07/03/16 1454  AST 19  ALT 15  ALKPHOS 69  BILITOT 0.4  PROT 7.0  ALBUMIN 3.8   No results for input(s): LIPASE, AMYLASE in the last 168 hours. No results for input(s): AMMONIA in the last 168 hours. Coagulation Profile: No results for input(s): INR, PROTIME in the last 168 hours. Cardiac Enzymes: No results for input(s): CKTOTAL, CKMB, CKMBINDEX, TROPONINI in the last 168 hours. BNP (last 3 results) No results for input(s): PROBNP in the last 8760 hours. HbA1C:  Recent Labs  07/04/16 0719  HGBA1C 10.9*   CBG:  Recent Labs Lab 07/04/16 1629 07/04/16 2042 07/05/16 0755 07/05/16 1155 07/05/16 1715  GLUCAP 321* 206* 277* 247* 182*   Lipid Profile: No results for input(s): CHOL, HDL, LDLCALC, TRIG, CHOLHDL, LDLDIRECT in the last 72 hours. Thyroid Function Tests: No results for input(s): TSH, T4TOTAL, FREET4, T3FREE, THYROIDAB in the last 72 hours. Anemia Panel: No results for input(s): VITAMINB12, FOLATE, FERRITIN, TIBC, IRON, RETICCTPCT in the last 72 hours. Sepsis Labs:  Recent Labs Lab 07/03/16 1943  LATICACIDVEN 3.80*    Recent Results (from the past 240 hour(s))  Aerobic/Anaerobic Culture (surgical/deep wound)     Status: None (Preliminary result)   Collection Time: 07/03/16  9:13 PM  Result Value Ref Range Status   Specimen Description ABSCESS RIGHT BREAST  Final   Special Requests PATIENT ON  FOLLOWING CIPRO  Final   Gram Stain   Final    ABUNDANT WBC PRESENT, PREDOMINANTLY PMN ABUNDANT GRAM POSITIVE COCCI IN CHAINS    Culture   Final    MODERATE GROUP B STREP(S.AGALACTIAE)ISOLATED TESTING AGAINST S. AGALACTIAE NOT ROUTINELY PERFORMED DUE TO PREDICTABILITY OF AMP/PEN/VAN SUSCEPTIBILITY. NO ANAEROBES ISOLATED; CULTURE IN PROGRESS FOR 5 DAYS    Report Status PENDING  Incomplete     Radiology Studies: No results found.  Scheduled Meds: . ampicillin-sulbactam (UNASYN) IV  3 g Intravenous Q6H  . docusate sodium  100 mg Oral BID  . enoxaparin (LOVENOX) injection  40 mg Subcutaneous Q24H  . insulin aspart  0-5 Units Subcutaneous QHS  . insulin aspart  0-9 Units Subcutaneous TID WC  . insulin aspart  8 Units Subcutaneous TID WC  . insulin glargine  22 Units Subcutaneous Daily  . sertraline  25 mg Oral Daily   Continuous Infusions: . sodium chloride 125 mL/hr at 07/05/16 1742     LOS: 2 days   Kayla Deshaies, Orpah Melter, MD [facility] Pager [phone]  If 7PM-7AM, please contact night-coverage [email or web address] Password [email or web address] 07/05/2016, 5:58 PM

## 2016-07-05 NOTE — Progress Notes (Signed)
Pharmacy Antibiotic Note  Danielle Peck is a 36 y.o. female admitted on 07/03/2016 with R-breast abscess.  Pharmacy has been consulted for Vancomycin and Zosyn dosing.   Vancomycin trough is low at 8 (drawn early) on 1g IV every 8 hours.  SCr 0.49 with est CrCl ~ 100 mL/min.   Abscess culture now growing Group B strep - Called and discussed with Dr. Rosendo Gros. Will change to Unasyn alone per telephone orders.   Plan: After discussion with Dr. Rosendo Gros, antibiotics will be changed Unasyn.  Unasyn 3g IV every 6 hours.  Monitor renal function, culture results, and clinical status.    Weight: 176 lb 5.9 oz (80 kg)  Temp (24hrs), Avg:98.3 F (36.8 C), Min:97.9 F (36.6 C), Max:98.6 F (37 C)   Recent Labs Lab 07/03/16 1454 07/03/16 1943 07/04/16 0719 07/05/16 0534 07/05/16 1236  WBC 12.0*  --  10.2  --   --   CREATININE 0.54  --  0.42* 0.49  --   LATICACIDVEN  --  3.80*  --   --   --   VANCOTROUGH  --   --   --   --  8*    Estimated Creatinine Clearance: 100.1 mL/min (by C-G formula based on SCr of 0.49 mg/dL).    Allergies  Allergen Reactions  . Tape Rash    Antimicrobials this admission: Vancomycin 3/16 >> 3/18 Zosyn 3/16 >>3/18 Unasyn 3/18 >>  Dose adjustments this admission: VT 8 on 1g IV every 8 hours.   Microbiology results: 3/16 Abscess culture - Group B Strep  Thank you for allowing pharmacy to be a part of this patient's care.  Sloan Leiter, PharmD, BCPS Clinical Pharmacist Clinical phone 07/05/2016 until 3:30 PM - [phone] After hours, please call [phone] 07/05/2016 2:00 PM

## 2016-07-05 NOTE — Progress Notes (Signed)
Gave 1 mg. Dilaudid IV for dressing change.

## 2016-07-06 ENCOUNTER — Inpatient Hospital Stay (HOSPITAL_COMMUNITY): Payer: BLUE CROSS/BLUE SHIELD

## 2016-07-06 DIAGNOSIS — M79609 Pain in unspecified limb: Secondary | ICD-10-CM

## 2016-07-06 LAB — GLUCOSE, CAPILLARY
GLUCOSE-CAPILLARY: 428 mg/dL — AB (ref 65–99)
GLUCOSE-CAPILLARY: 150 mg/dL — AB (ref 65–99)
Glucose-Capillary: 191 mg/dL — ABNORMAL HIGH (ref 65–99)
Glucose-Capillary: 152 mg/dL — ABNORMAL HIGH (ref 65–99)

## 2016-07-06 MED ORDER — INSULIN GLARGINE 100 UNIT/ML ~~LOC~~ SOLN: 28.0000 [IU] | Freq: Every day | SUBCUTANEOUS | Status: DC

## 2016-07-06 MED ORDER — OXYCODONE HCL 5 MG PO TABS: 5.0000 mg | ORAL_TABLET | ORAL | 0 refills | Status: DC | PRN

## 2016-07-06 MED ORDER — AMOXICILLIN-POT CLAVULANATE 875-125 MG PO TABS: 1.0000 | ORAL_TABLET | Freq: Two times a day (BID) | ORAL | 0 refills | Status: DC

## 2016-07-06 MED ORDER — INSULIN GLARGINE 100 UNITS/ML SOLOSTAR PEN: 28.0000 [IU] | PEN_INJECTOR | Freq: Every day | SUBCUTANEOUS | 0 refills | Status: DC

## 2016-07-06 MED ORDER — INSULIN GLARGINE 100 UNIT/ML ~~LOC~~ SOLN: 25.0000 [IU] | Freq: Every day | SUBCUTANEOUS | Status: DC

## 2016-07-06 MED ORDER — PEN NEEDLES 32G X 5 MM MISC: 1.0000 | Freq: Four times a day (QID) | 0 refills | Status: AC

## 2016-07-06 MED ORDER — INSULIN ASPART 100 UNIT/ML FLEXPEN: 8.0000 [IU] | PEN_INJECTOR | Freq: Three times a day (TID) | SUBCUTANEOUS | 0 refills | Status: DC

## 2016-07-06 MED ORDER — INSULIN GLARGINE 100 UNIT/ML ~~LOC~~ SOLN
28.0000 [IU] | Freq: Every day | SUBCUTANEOUS | Status: DC
  Administered 2016-07-06: 28 [IU] via SUBCUTANEOUS
  Filled 2016-07-06: qty 0.28

## 2016-07-06 NOTE — Progress Notes (Signed)
[facility] Progress Note  3 Days Post-Op  Subjective: More pain today than yesterday. Pain in her bilateral knees and calf, worse on the right, new since yesterday.   Objective: Vital signs in last 24 hours: Temp:  [98.2 F (36.8 C)-98.6 F (37 C)] 98.2 F (36.8 C) (03/19 0552) Pulse Rate:  [76-79] 77 (03/19 0552) Resp:  [17-19] 19 (03/19 0552) BP: (103-117)/(62-79) 117/79 (03/19 0552) SpO2:  [98 %] 98 % (03/19 0552) Last BM Date: 07/03/16  Intake/Output from previous day: 03/18 0701 - 03/19 0700 In: 4621.7 [P.O.:1280; I.V.:3041.7; IV Piggyback:300] Out: 2000 [Urine:2000] Intake/Output this shift: Total I/O In: 720 [P.O.:720] Out: -   PE: Gen:  Alert, NAD, pleasant, cooperative, well appearing Card:  RRR, no M/G/R heard Pulm:  CTA, no W/R/R, effort normal Skin: right breast with marked erythema and induration inferior to nipple, open wound with good granulation tissue and scant purulent drainage noted on dressing, repacked. Pt tolerated well Extremites: no increased erythema or warmth noted to right calf, TTP to midline calves bilaterally, full ROM of BLE. 2+DP pulses bilaterally  Lab Results:   Recent Labs  07/03/16 1454 07/04/16 0719  WBC 12.0* 10.2  HGB 14.0 13.0  HCT 40.2 37.5  PLT 308 292   BMET  Recent Labs  07/04/16 0719 07/05/16 0534  NA 136 136  K 3.5 3.5  CL 103 105  CO2 24 25  GLUCOSE 289* 286*  BUN 6 7  CREATININE 0.42* 0.49  CALCIUM 8.5* 8.4*   PT/INR No results for input(s): LABPROT, INR in the last 72 hours. CMP     Component Value Date/Time   NA 136 07/05/2016 0534   K 3.5 07/05/2016 0534   CL 105 07/05/2016 0534   CO2 25 07/05/2016 0534   GLUCOSE 286 (H) 07/05/2016 0534   BUN 7 07/05/2016 0534   CREATININE 0.49 07/05/2016 0534   CALCIUM 8.4 (L) 07/05/2016 0534   PROT 7.0 07/03/2016 1454   ALBUMIN 3.8 07/03/2016 1454   AST 19 07/03/2016 1454   ALT 15 07/03/2016 1454   ALKPHOS 69 07/03/2016 1454   BILITOT 0.4  07/03/2016 1454   GFRNONAA >60 07/05/2016 0534   GFRAA >60 07/05/2016 0534   Lipase  No results found for: LIPASE     Studies/Results: No results found.  Anti-infectives: Anti-infectives    Start     Dose/Rate Route Frequency Ordered Stop   07/05/16 1430  Ampicillin-Sulbactam (UNASYN) 3 g in sodium chloride 0.9 % 100 mL IVPB     3 g 200 mL/hr over 30 Minutes Intravenous Every 6 hours 07/05/16 1418     07/04/16 0600  vancomycin (VANCOCIN) IVPB 1000 mg/200 mL premix  Status:  Discontinued     1,000 mg 200 mL/hr over 60 Minutes Intravenous Every 8 hours 07/03/16 2150 07/05/16 1418   07/04/16 0600  piperacillin-tazobactam (ZOSYN) IVPB 3.375 g  Status:  Discontinued     3.375 g 12.5 mL/hr over 240 Minutes Intravenous Every 8 hours 07/03/16 2150 07/05/16 1418   07/04/16 0000  piperacillin-tazobactam (ZOSYN) IVPB 3.375 g     3.375 g 12.5 mL/hr over 240 Minutes Intravenous Every 6 hours 07/03/16 2152 07/04/16 0513   07/03/16 2035  vancomycin (VANCOCIN) 1-5 GM/200ML-% IVPB    Comments:  Haynes Bast   : cabinet override      07/03/16 2035 07/03/16 2130       Assessment/Plan Large right breast abscess s/p incision and drainage by Dr. Rosendo Gros 3/16 - BID dressing changes -  pt states she can do at home - culture grew Group B strep   Calf pain - pending LE DVT US  DC planned for today pending [place], 7 days of abx for cellulitis    LOS: 3 days    Kalman Drape , [facility] Surgery 07/06/2016, 1:13 PM Pager: [phone] Consults: [phone] Mon-Fri 7:00 am-4:30 pm Sat-Sun 7:00 am-11:30 am

## 2016-07-06 NOTE — Progress Notes (Signed)
Preliminary results by tech - Venous Duplex Lower Ext. Completed. Negative for deep and superficial vein thrombosis in both legs.  Davaun Quintela, BS, RDMS, RVT  

## 2016-07-06 NOTE — Discharge Summary (Signed)
[facility] Discharge Summary   Patient ID: Danielle Peck MRN: [id number] DOB/AGE: 1980/05/16 36 y.o.  Admit date: 07/03/2016 Discharge date: 07/06/2016  Admitting Diagnosis: Right breast abscess  Discharge Diagnosis Patient Active Problem List   Diagnosis Date Noted  . Breast abscess 07/03/2016    Consultants None  Imaging: No results found.  Procedures Dr. Rosendo Gros (07/03/16) - IRRIGATION AND DEBRIDEMENT WOUND (Right breast)  Hospital Course:  Danielle Peck is a 36yo female who presented to [facility] 07/03/16 with 2 week history of a breast abscess. He had been on antibiotics and had several needle aspirations and ultrasounds.  Patient was seen that day and was reevaluated with ultrasound which revealed a moderate-sized right breast abscess. Patient was to follow up with [facility] in outpatient clinic in 2 days however secondary to continued pain patient decided to proceed to the ED for further evaluation and management. Patient was admitted and underwent procedure listed above.  Tolerated procedure well and was transferred to the floor.  On POD1 she was started on BID wet to dry dressing changes. Culture grew Group B strep. On POD3 the patient was voiding well, tolerating diet, ambulating well, pain well controlled, vital signs stable, wound clean and felt stable for discharge home with home health nursing for wound care assistanace.  She will be on 7 days of antibiotics for cellulitis, and follow-up in our office in 1-2 weeks. She knows to call with any questions of concerns.  Patient was discharged in good condition.  The [place] Substance controlled database was reviewed prior to prescribing narcotic pain medication to this patient.   I was not involved in the care of this patient. All information in this discharge summary was obtained from this patient's EMR.   Allergies as of 07/06/2016      Reactions   Tape Rash      Medication List    STOP taking these  medications   VICTOZA 18 MG/3ML Sopn Generic drug:  liraglutide     TAKE these medications   acetaminophen 500 MG tablet Commonly known as:  TYLENOL Take 1,000 mg by mouth every 6 (six) hours as needed.   albuterol 108 (90 Base) MCG/ACT inhaler Commonly known as:  PROVENTIL HFA;VENTOLIN HFA Inhale 2 puffs into the lungs every 6 (six) hours as needed for wheezing or shortness of breath.   ALPRAZolam 0.5 MG tablet Commonly known as:  XANAX Take 0.5 mg by mouth at bedtime as needed for anxiety.   amoxicillin-clavulanate 875-125 MG tablet Commonly known as:  AUGMENTIN Take 1 tablet by mouth every 12 (twelve) hours.   COAL TAR EX Apply 1 application topically daily as needed (psoriasis).   insulin aspart 100 UNIT/ML FlexPen Commonly known as:  NOVOLOG Inject 8 Units into the skin 3 (three) times daily with meals.   insulin glargine 100 unit/mL Sopn Commonly known as:  LANTUS Inject 0.28 mLs (28 Units total) into the skin at bedtime.   levonorgestrel 20 MCG/24HR IUD Commonly known as:  MIRENA 1 Intra Uterine Device by Intrauterine route once.   oxyCODONE 5 MG immediate release tablet Commonly known as:  Oxy IR/ROXICODONE Take 1 tablet (5 mg total) by mouth every 4 (four) hours as needed for moderate pain (for dressing changes BID).   Pen Needles 32G X 5 MM Misc 1 Device by Does not apply route 4 (four) times daily. For use with insulin pen needles. Refills per PCP   sertraline 25 MG tablet Commonly known as:  ZOLOFT Take  50 mg by mouth daily. What changed:  Another medication with the same name was removed. Continue taking this medication, and follow the directions you see here.   VYVANSE 10 MG capsule Generic drug:  lisdexamfetamine Take 10 mg by mouth daily as needed.        Follow-up [facility], [place]. Call.   Specialty:  General Surgery Why:  to confirm your appointment date and time Contact information: [address] [place], [facility] 07/08/2016, 4:18 PM Pager: [phone] Consults: [phone] Mon-Fri 7:00 am-4:30 pm Sat-Sun 7:00 am-11:30 am

## 2016-07-06 NOTE — Progress Notes (Signed)
PROGRESS NOTE    Danielle Peck  ZOX:[id number] DOB: 1980-06-09 DOA: 07/03/2016 PCP: No PCP Per Patient    Brief Narrative:  36 year old female with pmh diabetes mellitus type 2, psoriasis, asthma, and anxiety; who presents with complaints of a 2 week history of right breast pain secondary to abscess. Evaluated at the breast clinic for which she underwent needle aspirations and ultrasounds while being on antibiotics without relief of symptoms. Patient noted progressively worsening pain for which she came to the ER and was seen have a 5.6 x 2.6 x 4.5 cm right breast abscess. Dr. Rosendo Gros of general surgery admitted the patient and she was taken to surgery for I&D. Initial lab work revealed WBC 16.4, sodium 132, potassium 4, chloride 101, CO2 21, glucose 488, and anion gap 10. She was given 6 units of NovoLog prior to going to surgery. Patient also admits blood sugars at home have been elevated.   Assessment & Plan:   Active Problems:   Breast abscess   1. Sepsis 2/2 Right breast abscess: Patient presented with right breast abscess. WBC 12, heart rates up to 105, and lactic acid 3.8. Patient evaluated by Dr. Rosendo Gros and pt has undergone I&D             - Continue per General surgery  2.    Diabetes mellitus type 2 with hyperglycemia: Acute.  Initial blood sugar elevated at 448. Diabetes regimen consists of Victoza.              - Stopped Victoza - Glucose improved with lantus at 22 units and aspart to 8 units before meals - Will increase lantus to 28 units. Continue 8 units premeal insulin. Titrate per PCP  Currently medically stable. OK for discharge If warranted by surgical service  DVT prophylaxis: Lovenox subQ Code Status: Full Family Communication: Pt in room, family not at bedside Disposition Plan: Uncertain at this time  Consultants:     Procedures:   Irrigation and debridement of R breast abscess  Antimicrobials: Anti-infectives    Start     Dose/Rate Route Frequency  Ordered Stop   07/05/16 1430  Ampicillin-Sulbactam (UNASYN) 3 g in sodium chloride 0.9 % 100 mL IVPB     3 g 200 mL/hr over 30 Minutes Intravenous Every 6 hours 07/05/16 1418     07/04/16 0600  vancomycin (VANCOCIN) IVPB 1000 mg/200 mL premix  Status:  Discontinued     1,000 mg 200 mL/hr over 60 Minutes Intravenous Every 8 hours 07/03/16 2150 07/05/16 1418   07/04/16 0600  piperacillin-tazobactam (ZOSYN) IVPB 3.375 g  Status:  Discontinued     3.375 g 12.5 mL/hr over 240 Minutes Intravenous Every 8 hours 07/03/16 2150 07/05/16 1418   07/04/16 0000  piperacillin-tazobactam (ZOSYN) IVPB 3.375 g     3.375 g 12.5 mL/hr over 240 Minutes Intravenous Every 6 hours 07/03/16 2152 07/04/16 0513   07/03/16 2035  vancomycin (VANCOCIN) 1-5 GM/200ML-% IVPB    Comments:  Mcphail, Nancy   : cabinet override      07/03/16 2035 07/03/16 2130      Subjective: Eager to go home  Objective: Vitals:   07/05/16 0553 07/05/16 1350 07/05/16 2130 07/06/16 0552  BP: (!) 111/59 103/67 108/62 117/79  Pulse: 74 76 79 77  Resp: 17 17 19 19   Temp: 98.4 F (36.9 C) 98.6 F (37 C) 98.2 F (36.8 C) 98.2 F (36.8 C)  TempSrc: Oral Oral Oral Oral  SpO2: 98% 98% 98% 98%  Weight:  Intake/Output Summary (Last 24 hours) at 07/06/16 1255 Last data filed at 07/06/16 1152  Gross per 24 hour  Intake          4941.67 ml  Output             1000 ml  Net          3941.67 ml   Filed Weights   07/03/16 2125  Weight: 80 kg (176 lb 5.9 oz)    Examination:  General exam: awake, conversant, in nad Respiratory system: normal resp effort, no audible wheezing Cardiovascular system: regular rate, s1-2 Gastrointestinal system: soft, nondistended, pos BS Central nervous system: no seizures, no tremors.    Data Reviewed: I have personally reviewed following labs and imaging studies  CBC:  Recent Labs Lab 07/03/16 1454 07/04/16 0719  WBC 12.0* 10.2  HGB 14.0 13.0  HCT 40.2 37.5  MCV 91.4 92.6  PLT 308  160   Basic Metabolic Panel:  Recent Labs Lab 07/03/16 1454 07/04/16 0719 07/05/16 0534  NA 132* 136 136  K 4.0 3.5 3.5  CL 101 103 105  CO2 21* 24 25  GLUCOSE 448* 289* 286*  BUN 7 6 7   CREATININE 0.54 0.42* 0.49  CALCIUM 8.8* 8.5* 8.4*   GFR: Estimated Creatinine Clearance: 100.1 mL/min (by C-G formula based on SCr of 0.49 mg/dL). Liver Function Tests:  Recent Labs Lab 07/03/16 1454  AST 19  ALT 15  ALKPHOS 69  BILITOT 0.4  PROT 7.0  ALBUMIN 3.8   No results for input(s): LIPASE, AMYLASE in the last 168 hours. No results for input(s): AMMONIA in the last 168 hours. Coagulation Profile: No results for input(s): INR, PROTIME in the last 168 hours. Cardiac Enzymes: No results for input(s): CKTOTAL, CKMB, CKMBINDEX, TROPONINI in the last 168 hours. BNP (last 3 results) No results for input(s): PROBNP in the last 8760 hours. HbA1C:  Recent Labs  07/04/16 0719  HGBA1C 10.9*   CBG:  Recent Labs Lab 07/05/16 1155 07/05/16 1715 07/05/16 2152 07/06/16 0759 07/06/16 1226  GLUCAP 247* 182* 202* 191* 150*   Lipid Profile: No results for input(s): CHOL, HDL, LDLCALC, TRIG, CHOLHDL, LDLDIRECT in the last 72 hours. Thyroid Function Tests: No results for input(s): TSH, T4TOTAL, FREET4, T3FREE, THYROIDAB in the last 72 hours. Anemia Panel: No results for input(s): VITAMINB12, FOLATE, FERRITIN, TIBC, IRON, RETICCTPCT in the last 72 hours. Sepsis Labs:  Recent Labs Lab 07/03/16 1943  LATICACIDVEN 3.80*    Recent Results (from the past 240 hour(s))  Aerobic/Anaerobic Culture (surgical/deep wound)     Status: None (Preliminary result)   Collection Time: 07/03/16  9:13 PM  Result Value Ref Range Status   Specimen Description ABSCESS RIGHT BREAST  Final   Special Requests PATIENT ON FOLLOWING CIPRO  Final   Gram Stain   Final    ABUNDANT WBC PRESENT, PREDOMINANTLY PMN ABUNDANT GRAM POSITIVE COCCI IN CHAINS    Culture   Final    MODERATE GROUP B  STREP(S.AGALACTIAE)ISOLATED TESTING AGAINST S. AGALACTIAE NOT ROUTINELY PERFORMED DUE TO PREDICTABILITY OF AMP/PEN/VAN SUSCEPTIBILITY. NO ANAEROBES ISOLATED; CULTURE IN PROGRESS FOR 5 DAYS    Report Status PENDING  Incomplete     Radiology Studies: No results found.  Scheduled Meds: . ampicillin-sulbactam (UNASYN) IV  3 g Intravenous Q6H  . docusate sodium  100 mg Oral BID  . enoxaparin (LOVENOX) injection  40 mg Subcutaneous Q24H  . insulin aspart  0-5 Units Subcutaneous QHS  . insulin aspart  0-9 Units Subcutaneous  TID WC  . insulin aspart  8 Units Subcutaneous TID WC  . [START ON 07/07/2016] insulin glargine  28 Units Subcutaneous Daily  . sertraline  25 mg Oral Daily   Continuous Infusions:    LOS: 3 days   Eagle Pitta, Orpah Melter, MD [facility] Pager [phone]  If 7PM-7AM, please contact night-coverage [email or web address] Password [facility] 07/06/2016, 12:55 PM

## 2016-07-06 NOTE — Progress Notes (Signed)
Diabetic teachings done. Instructions and demonstration for Insulin pen injections done, pt verbalized understanding. Adviced pt to make an appointment to her endocrinologist since she is now on insulin. Wound care teachings done, explained and demonstrated dressing changes. Some supplies given.  Discharge instructions given, verbalized understanding.

## 2016-07-06 NOTE — Discharge Instructions (Signed)
WOUND CARE: - dressing to be changed twice daily - supplies: sterile saline, kerlix or 4x4 gauze, scissors, ABD pads, tape  - remove dressing and all packing carefully, moistening with sterile saline as needed to avoid packing/internal dressing sticking to the wound. - clean edges of skin around the wound with water/gauze, making sure there is no tape debris or leakage left on skin that could cause skin irritation or breakdown. - dampen and clean kerlix with sterile saline and pack wound from wound base to skin level, making sure to take note of any possible areas of wound tracking, tunneling and packing appropriately. Wound can be packed loosely. Trim kerlix to size if a whole kerlix is not required. - cover wound with a dry ABD pad and secure with tape.  - write the date/time on the dry dressing/tape to better track when the last dressing change occurred. - apply any skin protectant/powder recommended by clinician to protect skin/skin folds. - change dressing as needed if leakage occurs, wound gets contaminated, or patient requests to shower. - patient may shower daily with wound open and following the shower the wound should be dried and a clean dressing placed.

## 2016-07-08 LAB — AEROBIC/ANAEROBIC CULTURE (SURGICAL/DEEP WOUND)

## 2016-07-08 LAB — AEROBIC/ANAEROBIC CULTURE W GRAM STAIN (SURGICAL/DEEP WOUND)

## 2018-06-29 ENCOUNTER — Emergency Department (HOSPITAL_COMMUNITY)
Admission: EM | Admit: 2018-06-29 | Discharge: 2018-06-30 | Disposition: A | Discharge status: Home / Self Care | Payer: BLUE CROSS/BLUE SHIELD | Attending: Emergency Medicine | Admitting: Emergency Medicine

## 2018-06-29 DIAGNOSIS — S61217A Laceration without foreign body of left little finger without damage to nail, initial encounter: Secondary | ICD-10-CM | POA: Insufficient documentation

## 2018-06-29 DIAGNOSIS — Z79899 Other long term (current) drug therapy: Secondary | ICD-10-CM | POA: Insufficient documentation

## 2018-06-29 DIAGNOSIS — Z9114 Patient's other noncompliance with medication regimen: Secondary | ICD-10-CM | POA: Insufficient documentation

## 2018-06-29 DIAGNOSIS — Y9389 Activity, other specified: Secondary | ICD-10-CM | POA: Diagnosis not present

## 2018-06-29 DIAGNOSIS — Y929 Unspecified place or not applicable: Secondary | ICD-10-CM | POA: Insufficient documentation

## 2018-06-29 DIAGNOSIS — S61213A Laceration without foreign body of left middle finger without damage to nail, initial encounter: Secondary | ICD-10-CM | POA: Diagnosis not present

## 2018-06-29 DIAGNOSIS — Z794 Long term (current) use of insulin: Secondary | ICD-10-CM | POA: Insufficient documentation

## 2018-06-29 DIAGNOSIS — S61211A Laceration without foreign body of left index finger without damage to nail, initial encounter: Secondary | ICD-10-CM | POA: Diagnosis not present

## 2018-06-29 DIAGNOSIS — S6992XA Unspecified injury of left wrist, hand and finger(s), initial encounter: Secondary | ICD-10-CM | POA: Diagnosis present

## 2018-06-29 DIAGNOSIS — Y999 Unspecified external cause status: Secondary | ICD-10-CM | POA: Diagnosis not present

## 2018-06-29 DIAGNOSIS — S61215A Laceration without foreign body of left ring finger without damage to nail, initial encounter: Secondary | ICD-10-CM | POA: Diagnosis not present

## 2018-06-29 DIAGNOSIS — J45909 Unspecified asthma, uncomplicated: Secondary | ICD-10-CM | POA: Diagnosis not present

## 2018-06-29 DIAGNOSIS — W260XXA Contact with knife, initial encounter: Secondary | ICD-10-CM | POA: Diagnosis not present

## 2018-06-29 DIAGNOSIS — E1165 Type 2 diabetes mellitus with hyperglycemia: Secondary | ICD-10-CM | POA: Insufficient documentation

## 2018-06-29 DIAGNOSIS — R739 Hyperglycemia, unspecified: Secondary | ICD-10-CM

## 2018-06-29 LAB — CBG MONITORING, ED: Glucose-Capillary: 539 mg/dL (ref 70–99)

## 2018-06-29 NOTE — ED Notes (Signed)
Bed: WTR8 Expected date:  Expected time:  Means of arrival:  Comments: 

## 2018-06-29 NOTE — ED Notes (Signed)
Bed: WTR9 Expected date:  Expected time:  Means of arrival:  Comments: 

## 2018-06-29 NOTE — ED Triage Notes (Signed)
Pt has lacerations on her left fingers, index finger and middle finger look the worse, bleeding is controlled, her index finger is also bent and she says that new after this injury Pt is very panicky and her blood sugar is elevated

## 2018-06-30 LAB — BASIC METABOLIC PANEL
Anion gap: 9 (ref 5–15)
BUN: 12 mg/dL (ref 6–20)
CALCIUM: 8.7 mg/dL — AB (ref 8.9–10.3)
CHLORIDE: 102 mmol/L (ref 98–111)
CO2: 22 mmol/L (ref 22–32)
CREATININE: 0.47 mg/dL (ref 0.44–1.00)
Glucose, Bld: 401 mg/dL — ABNORMAL HIGH (ref 70–99)
Potassium: 3.9 mmol/L (ref 3.5–5.1)
SODIUM: 133 mmol/L — AB (ref 135–145)

## 2018-06-30 LAB — CBC
HCT: 41.7 % (ref 36.0–46.0)
HEMOGLOBIN: 14.6 g/dL (ref 12.0–15.0)
MCH: 32.9 pg (ref 26.0–34.0)
MCHC: 35 g/dL (ref 30.0–36.0)
MCV: 93.9 fL (ref 80.0–100.0)
PLATELETS: 254 10*3/uL (ref 150–400)
RBC: 4.44 MIL/uL (ref 3.87–5.11)
RDW: 12 % (ref 11.5–15.5)
WBC: 8.9 10*3/uL (ref 4.0–10.5)
nRBC: 0 % (ref 0.0–0.2)

## 2018-06-30 LAB — CBG MONITORING, ED: GLUCOSE-CAPILLARY: 288 mg/dL — AB (ref 70–99)

## 2018-06-30 MED ORDER — LIDOCAINE HCL 2 % IJ SOLN
10.0000 mL | Freq: Once | INTRAMUSCULAR | Status: AC
Start: 2018-06-30 — End: 2018-06-30
  Administered 2018-06-30: 200 mg via INTRADERMAL
  Filled 2018-06-30: qty 20

## 2018-06-30 MED ORDER — SODIUM CHLORIDE 0.9 % IV BOLUS
1000.0000 mL | Freq: Once | INTRAVENOUS | Status: AC
  Administered 2018-06-30: 1000 mL via INTRAVENOUS

## 2018-06-30 MED ORDER — TETANUS-DIPHTH-ACELL PERTUSSIS 5-2.5-18.5 LF-MCG/0.5 IM SUSP
INTRAMUSCULAR | Status: AC
  Filled 2018-06-30: qty 0.5

## 2018-06-30 MED ORDER — LORAZEPAM 2 MG/ML IJ SOLN
1.0000 mg | Freq: Once | INTRAMUSCULAR | Status: AC
  Administered 2018-06-30: 1 mg via INTRAVENOUS
  Filled 2018-06-30: qty 1

## 2018-06-30 NOTE — ED Notes (Signed)
DOWNTIME COMPLETE. Please refer to paper chart for information

## 2018-06-30 NOTE — ED Provider Notes (Signed)
[facility] DEPT Provider Note   CSN: [id number] Arrival date & time: 06/29/18  2205    History   Chief Complaint Chief Complaint  Patient presents with  . finger lacerations    HPI Danielle Peck is a 38 y.o. female.     HPI 38 year old female with past medical history of asthma and anxiety here with left finger lacerations.  The patient states she was trying to open a bottle of emergency, when she slipped with a knife.  She got the dorsal aspect of her left first through fourth fingers.  She reports immediate onset of sharp, stabbing, stinging, pain that is worse with movement and palpation.  She tried to rinse the wounds, and they started bleeding.  She subsequent presents evaluation.  She is right-handed.  She otherwise denies any complaints.  She does note that her blood sugars been elevated for the last several months.  She states she has not been taking her diabetic medications as prescribed due to recent stressors, and she is no longer eating a regular diabetic diet.  She is had some increased thirst and fatigue.  No weight loss or night sweats.  No other complaints.  She is unsure of her last tetanus.  Past Medical History:  Diagnosis Date  . Asthma   . Diabetes mellitus without complication ([place])   . HSV-2 infection   . Psoriasis     Patient Active Problem List   Diagnosis Date Noted  . Breast abscess 07/03/2016    Past Surgical History:  Procedure Laterality Date  . APPENDECTOMY    . INCISION AND DRAINAGE OF WOUND Right 07/03/2016   Procedure: IRRIGATION AND DEBRIDEMENT WOUND;  Surgeon: Ralene Ok, MD;  Location: [facility];  Service: General;  Laterality: Right;     OB History   No obstetric history on file.      Home Medications    Prior to Admission medications   Medication Sig Start Date End Date Taking? Authorizing Provider  acetaminophen (TYLENOL) 500 MG tablet Take 1,000 mg by mouth every 6 (six) hours as needed.     [provider]  albuterol (PROVENTIL HFA;VENTOLIN HFA) 108 (90 Base) MCG/ACT inhaler Inhale 2 puffs into the lungs every 6 (six) hours as needed for wheezing or shortness of breath. 02/14/16   Jaynee Eagles, PA-C  ALPRAZolam Duanne Moron) 0.5 MG tablet Take 0.5 mg by mouth at bedtime as needed for anxiety.    [provider]  amoxicillin-clavulanate (AUGMENTIN) 875-125 MG tablet Take 1 tablet by mouth every 12 (twelve) hours. 07/06/16   Focht, Fraser Din, PA  COAL TAR EX Apply 1 application topically daily as needed (psoriasis).    [provider]  insulin aspart (NOVOLOG) 100 UNIT/ML FlexPen Inject 8 Units into the skin 3 (three) times daily with meals. 07/06/16   Donne Hazel, MD  insulin glargine (LANTUS) 100 unit/mL SOPN Inject 0.28 mLs (28 Units total) into the skin at bedtime. 07/06/16   Donne Hazel, MD  Insulin Pen Needle (PEN NEEDLES) 32G X 5 MM MISC 1 Device by Does not apply route 4 (four) times daily. For use with insulin pen needles. Refills per PCP 07/06/16   Donne Hazel, MD  levonorgestrel (MIRENA) 20 MCG/24HR IUD 1 Intra Uterine Device by Intrauterine route once.    [provider]  oxyCODONE (OXY IR/ROXICODONE) 5 MG immediate release tablet Take 1 tablet (5 mg total) by mouth every 4 (four) hours as needed for moderate pain (for dressing  changes BID). 07/06/16   Focht, Fraser Din, PA  sertraline (ZOLOFT) 25 MG tablet Take 50 mg by mouth daily.     [provider]  VYVANSE 10 MG capsule Take 10 mg by mouth daily as needed. 05/18/16   [provider]    Family History No family history on file.  Social History Social History   Tobacco Use  . Smoking status: Never Smoker  . Smokeless tobacco: Never Used  Substance Use Topics  . Alcohol use: Yes  . Drug use: No     Allergies   Tape   Review of Systems Review of Systems  Constitutional: Negative for chills, fatigue and fever.  HENT: Negative for congestion and  rhinorrhea.   Eyes: Negative for visual disturbance.  Respiratory: Negative for cough, shortness of breath and wheezing.   Cardiovascular: Negative for chest pain and leg swelling.  Gastrointestinal: Negative for abdominal pain, diarrhea, nausea and vomiting.  Genitourinary: Negative for dysuria and flank pain.  Musculoskeletal: Negative for neck pain and neck stiffness.  Skin: Positive for wound. Negative for rash.  Allergic/Immunologic: Negative for immunocompromised state.  Neurological: Negative for syncope and headaches.  All other systems reviewed and are negative.    Physical Exam Updated Vital Signs BP (!) 89/51   Pulse 91   Temp 97.9 F (36.6 C) (Oral)   Resp 17   Ht 5\' 3"  (1.6 m)   Wt 86.2 kg   SpO2 99%   BMI 33.66 kg/m   Physical Exam Vitals signs and nursing note reviewed.  Constitutional:      General: She is not in acute distress.    Appearance: She is well-developed.  HENT:     Head: Normocephalic and atraumatic.  Eyes:     Conjunctiva/sclera: Conjunctivae normal.  Neck:     Musculoskeletal: Neck supple.  Cardiovascular:     Rate and Rhythm: Normal rate and regular rhythm.     Heart sounds: Normal heart sounds. No murmur. No friction rub.  Pulmonary:     Effort: Pulmonary effort is normal. No respiratory distress.     Breath sounds: Normal breath sounds. No wheezing or rales.  Abdominal:     General: There is no distension.     Palpations: Abdomen is soft.     Tenderness: There is no abdominal tenderness.  Skin:    General: Skin is warm.     Capillary Refill: Capillary refill takes less than 2 seconds.  Neurological:     Mental Status: She is alert and oriented to person, place, and time.     Motor: No abnormal muscle tone.     UPPER EXTREMITY EXAM: LEFT  INSPECTION & PALPATION: Superficial linear lacerations along dorsum of left hand fingers. Index finger with deep laceration along middle phalanx with slight flexion at DIP when finger held  at rest. Ulnar laceration noted to middle finger middle phalanx. Superficial lacerations/abrasions to ring and small finger middle phalanxes.   SENSORY: Sensation is intact to light touch in:  Superficial radial nerve distribution (dorsal first web space) Median nerve distribution (tip of index finger)   Ulnar nerve distribution (tip of small finger)     MOTOR:  + Motor posterior interosseous nerve (thumb IP extension) + Anterior interosseous nerve (thumb IP flexion, index finger DIP flexion) + Radial nerve (wrist extension) + Median nerve (palpable firing thenar mass) + Ulnar nerve (palpable firing of first dorsal interosseous muscle)  VASCULAR: 2+ radial pulse Brisk capillary refill < 2 sec, fingers warm and  well-perfused  TENDONS: Slight passive flexion at DIP noted to left index finger. Pt is able to extend and flex at DIP voluntarily. No deformity.    ED Treatments / Results  Labs (all labs ordered are listed, but only abnormal results are displayed) Labs Reviewed  BASIC METABOLIC PANEL - Abnormal; Notable for the following components:      Result Value   Sodium 133 (*)    Glucose, Bld 401 (*)    Calcium 8.7 (*)    All other components within normal limits  CBG MONITORING, ED - Abnormal; Notable for the following components:   Glucose-Capillary 539 (*)    All other components within normal limits  CBG MONITORING, ED - Abnormal; Notable for the following components:   Glucose-Capillary 288 (*)    All other components within normal limits  CBC    EKG None  Radiology No results found.  Procedures .Marland KitchenLaceration Repair Date/Time: 06/30/2018 7:06 AM Performed by: Duffy Bruce, MD Authorized by: Duffy Bruce, MD   Consent:    Consent obtained:  Verbal   Consent given by:  Patient   Risks discussed:  Infection, need for additional repair, pain, tendon damage, retained foreign body, vascular damage, poor cosmetic result, poor wound healing and nerve damage    Alternatives discussed:  Referral and delayed treatment Anesthesia (see MAR for exact dosages):    Anesthesia method:  Nerve block   Block location:  Digital   Block needle gauge:  27 G   Block anesthetic:  Lidocaine 1% w/o epi   Block injection procedure:  Anatomic landmarks identified, anatomic landmarks palpated, introduced needle, negative aspiration for blood and incremental injection   Block outcome:  Anesthesia achieved Laceration details:    Location: Left index finger.   Length (cm):  1 Repair type:    Repair type:  Simple Pre-procedure details:    Preparation:  Patient was prepped and draped in usual sterile fashion and imaging obtained to evaluate for foreign bodies Exploration:    Hemostasis achieved with:  Direct pressure   Wound exploration: wound explored through full range of motion and entire depth of wound probed and visualized   Treatment:    Area cleansed with:  Betadine   Amount of cleaning:  Extensive   Irrigation solution:  Sterile water   Irrigation method:  Pressure wash Skin repair:    Repair method:  Sutures   Suture size:  5-0   Suture material:  Prolene   Suture technique:  Simple interrupted   Number of sutures:  4 Approximation:    Approximation:  Close Post-procedure details:    Dressing:  Antibiotic ointment and splint for protection   Patient tolerance of procedure:  Tolerated well, no immediate complications .Marland KitchenLaceration Repair Date/Time: 06/30/2018 7:07 AM Performed by: Duffy Bruce, MD Authorized by: Duffy Bruce, MD   Consent:    Consent obtained:  Verbal   Consent given by:  Patient   Risks discussed:  Infection, need for additional repair, pain, tendon damage, retained foreign body, vascular damage, poor cosmetic result, poor wound healing and nerve damage   Alternatives discussed:  Referral and delayed treatment Anesthesia (see MAR for exact dosages):    Anesthesia method:  Local infiltration   Local anesthetic:  Lidocaine 1%  WITH epi Laceration details:    Location: Left middle finger middle phalanx.   Length (cm):  1 Repair type:    Repair type:  Simple Pre-procedure details:    Preparation:  Patient was prepped and draped in usual  sterile fashion and imaging obtained to evaluate for foreign bodies Exploration:    Hemostasis achieved with:  Direct pressure   Wound exploration: wound explored through full range of motion and entire depth of wound probed and visualized   Treatment:    Area cleansed with:  Betadine   Amount of cleaning:  Extensive   Irrigation solution:  Sterile water   Irrigation method:  Pressure wash Skin repair:    Repair method:  Sutures   Suture size:  5-0   Suture material:  Prolene   Suture technique:  Simple interrupted   Number of sutures:  3 Approximation:    Approximation:  Close Post-procedure details:    Dressing:  Antibiotic ointment   Patient tolerance of procedure:  Tolerated well, no immediate complications   (including critical care time)  Medications Ordered in ED Medications  Tdap (BOOSTRIX) 5-2.5-18.5 LF-MCG/0.5 injection (has no administration in time range)  sodium chloride 0.9 % bolus 1,000 mL (0 mLs Intravenous Stopped 06/30/18 0504)  lidocaine (XYLOCAINE) 2 % (with pres) injection 200 mg (200 mg Intradermal Given by Other 06/30/18 0112)  LORazepam (ATIVAN) injection 1 mg (1 mg Intravenous Given 06/30/18 0113)     Initial Impression / Assessment and Plan / ED Course  I have reviewed the triage vital signs and the nursing notes.  Pertinent labs & imaging results that were available during my care of the patient were reviewed by me and considered in my medical decision making (see chart for details).        NOTE: Documentation delayed due to Epic downtime.  Briefly, the patient is a 38 year old right-hand-dominant female here with accidental laceration to the dorsum of the left hand.  Patient also incidentally hyperglycemic and has not been taking her  medications.  Regarding her lacerations, she does have slight flexion at the DIP of the left index finger with a deeper laceration along the dorsum of the middle phalanx.  She has intact distal sensation and perfusion, but clinically, concern for possible extensor sheath injury.  Patient was thoroughly irrigated and cleansed, and superficial closure performed.  Patient was placed in a splint to facilitate healing, and will refer her for urgent hand follow-up within the next several days.  The importance of follow-up was discussed with risk of poor healing and chronic contracture if not.  Otherwise, the deeper laceration along the middle finger was also closed with sutures, and remaining lacerations were closed with Dermabond after thorough cleaning.  Tetanus is up-to-date.  No evidence of deeper laceration and this was with a clean knife with no concern for foreign body or bony abnormality.  Regarding her hyperglycemia, no signs of DKA.  She was given fluids.  She has not been compliant with her medications and I reiterated the importance of taking her meds.  She will follow-up with her PCP.  Final Clinical Impressions(s) / ED Diagnoses   Final diagnoses:  Hyperglycemia  Laceration of left index finger, foreign body presence unspecified, nail damage status unspecified, initial encounter    ED Discharge Orders    None       Duffy Bruce, MD 06/30/18 [phone]

## 2018-06-30 NOTE — ED Notes (Signed)
Suture cart at bedside 

## 2018-07-11 ENCOUNTER — Other Ambulatory Visit: Payer: Self-pay

## 2018-07-11 ENCOUNTER — Encounter (HOSPITAL_BASED_OUTPATIENT_CLINIC_OR_DEPARTMENT_OTHER): Payer: Self-pay | Admitting: *Deleted

## 2018-07-12 ENCOUNTER — Other Ambulatory Visit: Payer: Self-pay

## 2018-07-12 ENCOUNTER — Encounter (HOSPITAL_BASED_OUTPATIENT_CLINIC_OR_DEPARTMENT_OTHER)
Admission: RE | Admit: 2018-07-12 | Discharge: 2018-07-12 | Disposition: A | Discharge status: Home / Self Care | Payer: BLUE CROSS/BLUE SHIELD | Source: Ambulatory Visit | Attending: Orthopedic Surgery | Admitting: Orthopedic Surgery

## 2018-07-12 DIAGNOSIS — Z87891 Personal history of nicotine dependence: Secondary | ICD-10-CM | POA: Diagnosis not present

## 2018-07-12 DIAGNOSIS — W260XXA Contact with knife, initial encounter: Secondary | ICD-10-CM | POA: Diagnosis not present

## 2018-07-12 DIAGNOSIS — S61211A Laceration without foreign body of left index finger without damage to nail, initial encounter: Secondary | ICD-10-CM | POA: Diagnosis not present

## 2018-07-12 DIAGNOSIS — S66321A Laceration of extensor muscle, fascia and tendon of left index finger at wrist and hand level, initial encounter: Secondary | ICD-10-CM | POA: Diagnosis not present

## 2018-07-12 DIAGNOSIS — Z01818 Encounter for other preprocedural examination: Secondary | ICD-10-CM | POA: Diagnosis not present

## 2018-07-12 DIAGNOSIS — E1165 Type 2 diabetes mellitus with hyperglycemia: Secondary | ICD-10-CM | POA: Diagnosis not present

## 2018-07-12 DIAGNOSIS — Z7984 Long term (current) use of oral hypoglycemic drugs: Secondary | ICD-10-CM | POA: Diagnosis not present

## 2018-07-12 DIAGNOSIS — Y9389 Activity, other specified: Secondary | ICD-10-CM | POA: Diagnosis not present

## 2018-07-12 LAB — BASIC METABOLIC PANEL
Anion gap: 8 (ref 5–15)
BUN: 13 mg/dL (ref 6–20)
CO2: 21 mmol/L — ABNORMAL LOW (ref 22–32)
Calcium: 9.2 mg/dL (ref 8.9–10.3)
Chloride: 102 mmol/L (ref 98–111)
Creatinine, Ser: 0.45 mg/dL (ref 0.44–1.00)
GFR calc Af Amer: >60 mL/min (ref >60)
GFR calc non Af Amer: >60 mL/min (ref >60)
Glucose, Bld: 291 mg/dL — ABNORMAL HIGH (ref 70–99)
Potassium: 4.1 mmol/L (ref 3.5–5.1)
Sodium: 131 mmol/L — ABNORMAL LOW (ref 135–145)

## 2018-07-12 NOTE — H&P (Signed)
Cliffie Gingras Peck is an 38 y.o. female.   Chief Complaint: LEFT INDEX FINGER LACERATION  HPI: The patient is a 38 y/o right hand dominant female who was trying to open a bottle with a knife on 06/30/18 but the knife slipped and she cut the dorsal part of her hand. She was originally treated in the emergency department with a laceration repair and antibiotics.  She was seen in our office due to the inability to fully extend the DIP joint of the index finger. Discussed the reason and rationale for surgical intervention and the possibility of a tendon repair.  She is here today for surgery.  She denies chest pain, shortness of breath, fever, chills, nausea, vomiting, or diarrhea.    Past Medical History:  Diagnosis Date  . Asthma   . Diabetes mellitus without complication ([place])   . HSV-2 infection   . Psoriasis     Past Surgical History:  Procedure Laterality Date  . APPENDECTOMY    . INCISION AND DRAINAGE OF WOUND Right 07/03/2016   Procedure: IRRIGATION AND DEBRIDEMENT WOUND;  Surgeon: Ralene Ok, MD;  Location: [facility];  Service: General;  Laterality: Right;    History reviewed. No pertinent family history. Social History:  reports that she has never smoked. She has never used smokeless tobacco. She reports current alcohol use. She reports that she does not use drugs.  Allergies:  Allergies  Allergen Reactions  . Tape Rash    No medications prior to admission.    No results found for this or any previous visit (from the past 48 hour(s)). No results found.  ROS NO RECENT ILLNESSES OR HOSPITALIZATIONS    Height 5\' 3"  (1.6 m), weight 79.4 kg. Physical Exam  General Appearance:  Alert, cooperative, no distress, appears stated age  Head:  Normocephalic, without obvious abnormality, atraumatic  Eyes:  Pupils equal, conjunctiva/corneas clear,         Throat: Lips, mucosa, and tongue normal; teeth and gums normal  Neck: No visible masses     Lungs:   respirations unlabored   Chest Wall:  No tenderness or deformity  Heart:  Regular rate and rhythm,  Abdomen:   Soft, non-tender,         Extremities: LUE - LACERATION ON THE DORSAL ASPECT OF THE INDEX FINGER AT THE MIDDLE PHALANX WITH SUTURES INTACT. NO ERYTHEMA OR DRAINAGE. CAPILLARY REFILL LESS THAN 2 SECONDS. SENSATION INTACT TO LIGHT TOUCH DISTALLY. LIMITED FLEXION OF THE INDEX FINGER. LIMITED EXTENSION OF THE DIP JOINT OF THE INDEX FINGER. FULL RANGE OF MOTION OF ALL OTHER DIGITS.    Pulses: 2+ and symmetric  Skin: Skin color, texture, turgor normal, no rashes or lesions     Neurologic: Normal    Assessment LEFT INDEX FINGER LACERATION WITH TENDON INVOLVEMENT   Plan LEFT INDEX FINGER WOUND EXPLORATION AND POSSIBLE TENDON REPAIR  WE ARE PLANNING SURGERY FOR YOUR UPPER EXTREMITY. THE RISKS AND BENEFITS OF SURGERY INCLUDE BUT NOT LIMITED TO BLEEDING INFECTION, DAMAGE TO NEARBY NERVES ARTERIES TENDONS, FAILURE OF SURGERY TO ACCOMPLISH ITS INTENDED GOALS, PERSISTENT SYMPTOMS AND NEED FOR FURTHER SURGICAL INTERVENTION. WITH THIS IN MIND WE WILL PROCEED. I HAVE DISCUSSED WITH THE PATIENT THE PRE AND POSTOPERATIVE REGIMEN AND THE DOS AND DON'TS. PT VOICED UNDERSTANDING AND INFORMED CONSENT SIGNED.  R/B/A DISCUSSED WITH PT IN OFFICE.  PT VOICED UNDERSTANDING OF PLAN CONSENT SIGNED DAY OF SURGERY PT SEEN AND EXAMINED PRIOR TO OPERATIVE PROCEDURE/DAY OF SURGERY SITE MARKED. QUESTIONS ANSWERED WILL GO HOME FOLLOWING  SURGERY  Danielle Peck [facility] MD 07-13-18    Danielle Peck 07/12/2018, 10:51 AM

## 2018-07-12 NOTE — Progress Notes (Signed)
PAT results reviewed- pt's blood sugar 291. Placed results on chart in Pre op. I called pt to give her the results and see how she was feeling. She states that her blood sugars have been running high. I instructed pt to check it often tonight and to go to the ED if she is not feeling.well.

## 2018-07-13 ENCOUNTER — Other Ambulatory Visit: Payer: Self-pay

## 2018-07-13 ENCOUNTER — Ambulatory Visit (HOSPITAL_BASED_OUTPATIENT_CLINIC_OR_DEPARTMENT_OTHER): Payer: BLUE CROSS/BLUE SHIELD | Admitting: Certified Registered Nurse Anesthetist

## 2018-07-13 ENCOUNTER — Encounter (HOSPITAL_BASED_OUTPATIENT_CLINIC_OR_DEPARTMENT_OTHER): Payer: Self-pay | Admitting: Certified Registered Nurse Anesthetist

## 2018-07-13 ENCOUNTER — Ambulatory Visit (HOSPITAL_BASED_OUTPATIENT_CLINIC_OR_DEPARTMENT_OTHER)
Admission: RE | Admit: 2018-07-13 | Discharge: 2018-07-13 | Disposition: A | Discharge status: Home / Self Care | Payer: BLUE CROSS/BLUE SHIELD | Attending: Orthopedic Surgery | Admitting: Orthopedic Surgery

## 2018-07-13 ENCOUNTER — Encounter (HOSPITAL_BASED_OUTPATIENT_CLINIC_OR_DEPARTMENT_OTHER)
Admission: RE | Disposition: A | Discharge status: Home / Self Care | Payer: Self-pay | Source: Home / Self Care | Attending: Orthopedic Surgery

## 2018-07-13 DIAGNOSIS — S66321A Laceration of extensor muscle, fascia and tendon of left index finger at wrist and hand level, initial encounter: Secondary | ICD-10-CM | POA: Insufficient documentation

## 2018-07-13 DIAGNOSIS — E1165 Type 2 diabetes mellitus with hyperglycemia: Secondary | ICD-10-CM | POA: Insufficient documentation

## 2018-07-13 DIAGNOSIS — Y9389 Activity, other specified: Secondary | ICD-10-CM | POA: Insufficient documentation

## 2018-07-13 DIAGNOSIS — W260XXA Contact with knife, initial encounter: Secondary | ICD-10-CM | POA: Insufficient documentation

## 2018-07-13 DIAGNOSIS — Z7984 Long term (current) use of oral hypoglycemic drugs: Secondary | ICD-10-CM | POA: Insufficient documentation

## 2018-07-13 DIAGNOSIS — S61211A Laceration without foreign body of left index finger without damage to nail, initial encounter: Secondary | ICD-10-CM | POA: Insufficient documentation

## 2018-07-13 DIAGNOSIS — Z87891 Personal history of nicotine dependence: Secondary | ICD-10-CM | POA: Insufficient documentation

## 2018-07-13 HISTORY — PX: WOUND EXPLORATION: SHX6188

## 2018-07-13 HISTORY — DX: Unspecified open wound, unspecified lower leg, initial encounter: S81.809A

## 2018-07-13 LAB — GLUCOSE, CAPILLARY
GLUCOSE-CAPILLARY: 226 mg/dL — AB (ref 70–99)
Glucose-Capillary: 226 mg/dL — ABNORMAL HIGH (ref 70–99)

## 2018-07-13 SURGERY — WOUND EXPLORATION
Anesthesia: Monitor Anesthesia Care | Site: Finger | Laterality: Left

## 2018-07-13 MED ORDER — LIDOCAINE HCL (CARDIAC) PF 100 MG/5ML IV SOSY
PREFILLED_SYRINGE | INTRAVENOUS | Status: DC | PRN
  Administered 2018-07-13: 60 mg via INTRAVENOUS

## 2018-07-13 MED ORDER — LIDOCAINE HCL (PF) 1 % IJ SOLN
INTRAMUSCULAR | Status: AC
  Filled 2018-07-13: qty 30

## 2018-07-13 MED ORDER — FENTANYL CITRATE (PF) 100 MCG/2ML IJ SOLN
INTRAMUSCULAR | Status: AC
  Filled 2018-07-13: qty 2

## 2018-07-13 MED ORDER — LIDOCAINE HCL 1 % IJ SOLN
INTRAMUSCULAR | Status: DC | PRN
  Administered 2018-07-13: 10 mL via INTRAMUSCULAR

## 2018-07-13 MED ORDER — PROMETHAZINE HCL 25 MG/ML IJ SOLN: 6.2500 mg | INTRAMUSCULAR | Status: DC | PRN

## 2018-07-13 MED ORDER — CEFAZOLIN SODIUM-DEXTROSE 2-4 GM/100ML-% IV SOLN
2.0000 g | INTRAVENOUS | Status: AC
  Administered 2018-07-13: 2 g via INTRAVENOUS

## 2018-07-13 MED ORDER — FENTANYL CITRATE (PF) 100 MCG/2ML IJ SOLN
50.0000 ug | INTRAMUSCULAR | Status: DC | PRN
  Administered 2018-07-13: 100 ug via INTRAVENOUS

## 2018-07-13 MED ORDER — MIDAZOLAM HCL 2 MG/2ML IJ SOLN: 1.0000 mg | INTRAMUSCULAR | Status: DC | PRN

## 2018-07-13 MED ORDER — HYDROMORPHONE HCL 1 MG/ML IJ SOLN: 0.2500 mg | INTRAMUSCULAR | Status: DC | PRN

## 2018-07-13 MED ORDER — OXYCODONE HCL 5 MG/5ML PO SOLN: 5.0000 mg | Freq: Once | ORAL | Status: DC | PRN

## 2018-07-13 MED ORDER — CHLORHEXIDINE GLUCONATE 4 % EX LIQD: 60.0000 mL | Freq: Once | CUTANEOUS | Status: DC

## 2018-07-13 MED ORDER — LACTATED RINGERS IV SOLN
INTRAVENOUS | Status: DC
  Administered 2018-07-13: 12:00:00 via INTRAVENOUS

## 2018-07-13 MED ORDER — MIDAZOLAM HCL 2 MG/2ML IJ SOLN
INTRAMUSCULAR | Status: AC
  Filled 2018-07-13: qty 2

## 2018-07-13 MED ORDER — CEFAZOLIN SODIUM-DEXTROSE 2-4 GM/100ML-% IV SOLN
INTRAVENOUS | Status: AC
  Filled 2018-07-13: qty 100

## 2018-07-13 MED ORDER — ONDANSETRON HCL 4 MG/2ML IJ SOLN
INTRAMUSCULAR | Status: DC | PRN
  Administered 2018-07-13: 4 mg via INTRAVENOUS

## 2018-07-13 MED ORDER — SCOPOLAMINE 1 MG/3DAYS TD PT72: 1.0000 | MEDICATED_PATCH | Freq: Once | TRANSDERMAL | Status: DC | PRN

## 2018-07-13 MED ORDER — PROPOFOL 500 MG/50ML IV EMUL
INTRAVENOUS | Status: DC | PRN
  Administered 2018-07-13: 75 ug/kg/min via INTRAVENOUS

## 2018-07-13 MED ORDER — BUPIVACAINE HCL (PF) 0.25 % IJ SOLN
INTRAMUSCULAR | Status: AC
  Filled 2018-07-13: qty 30

## 2018-07-13 MED ORDER — OXYCODONE HCL 5 MG PO TABS: 5.0000 mg | ORAL_TABLET | Freq: Once | ORAL | Status: DC | PRN

## 2018-07-13 SURGICAL SUPPLY — 61 items
BANDAGE ACE 3X5.8 VEL STRL LF (GAUZE/BANDAGES/DRESSINGS) ×3 IMPLANT
BANDAGE ACE 4X5 VEL STRL LF (GAUZE/BANDAGES/DRESSINGS) IMPLANT
BENZOIN TINCTURE PRP APPL 2/3 (GAUZE/BANDAGES/DRESSINGS) IMPLANT
BLADE SURG 15 STRL LF DISP TIS (BLADE) ×1 IMPLANT
BLADE SURG 15 STRL SS (BLADE) ×2
BNDG COHESIVE 1X5 TAN STRL LF (GAUZE/BANDAGES/DRESSINGS) ×3 IMPLANT
BNDG ESMARK 4X9 LF (GAUZE/BANDAGES/DRESSINGS) ×3 IMPLANT
BNDG GAUZE ELAST 4 BULKY (GAUZE/BANDAGES/DRESSINGS) ×3 IMPLANT
CLOSURE WOUND 1/2 X4 (GAUZE/BANDAGES/DRESSINGS)
CORD BIPOLAR FORCEPS 12FT (ELECTRODE) ×3 IMPLANT
COVER BACK TABLE 60X90IN (DRAPES) ×3 IMPLANT
COVER MAYO STAND STRL (DRAPES) ×3 IMPLANT
COVER WAND RF STERILE (DRAPES) IMPLANT
CUFF TOURN SGL QUICK 18X4 (TOURNIQUET CUFF) ×3 IMPLANT
DRAPE EXTREMITY T 121X128X90 (DISPOSABLE) ×3 IMPLANT
DRAPE SURG 17X23 STRL (DRAPES) ×3 IMPLANT
DRSG EMULSION OIL 3X3 NADH (GAUZE/BANDAGES/DRESSINGS) IMPLANT
GAUZE SPONGE 4X4 12PLY STRL (GAUZE/BANDAGES/DRESSINGS) ×3 IMPLANT
GLOVE BIO SURGEON STRL SZ 6.5 (GLOVE) ×4 IMPLANT
GLOVE BIO SURGEONS STRL SZ 6.5 (GLOVE) ×2
GLOVE BIOGEL PI IND STRL 7.0 (GLOVE) ×2 IMPLANT
GLOVE BIOGEL PI IND STRL 8.5 (GLOVE) ×1 IMPLANT
GLOVE BIOGEL PI INDICATOR 7.0 (GLOVE) ×4
GLOVE BIOGEL PI INDICATOR 8.5 (GLOVE) ×2
GLOVE SURG ORTHO 8.0 STRL STRW (GLOVE) ×3 IMPLANT
GOWN STRL REUS W/ TWL LRG LVL3 (GOWN DISPOSABLE) ×2 IMPLANT
GOWN STRL REUS W/ TWL XL LVL3 (GOWN DISPOSABLE) ×1 IMPLANT
GOWN STRL REUS W/TWL LRG LVL3 (GOWN DISPOSABLE) ×4
GOWN STRL REUS W/TWL XL LVL3 (GOWN DISPOSABLE) ×2
LOOP VESSEL MAXI BLUE (MISCELLANEOUS) IMPLANT
LOOP VESSEL MINI RED (MISCELLANEOUS) IMPLANT
NEEDLE HYPO 25X1 1.5 SAFETY (NEEDLE) ×3 IMPLANT
NS IRRIG 1000ML POUR BTL (IV SOLUTION) ×3 IMPLANT
PACK BASIN DAY SURGERY FS (CUSTOM PROCEDURE TRAY) ×3 IMPLANT
PAD CAST 4YDX4 CTTN HI CHSV (CAST SUPPLIES) IMPLANT
PADDING CAST ABS 4INX4YD NS (CAST SUPPLIES) ×2
PADDING CAST ABS COTTON 4X4 ST (CAST SUPPLIES) ×1 IMPLANT
PADDING CAST COTTON 4X4 STRL (CAST SUPPLIES)
SLEEVE SCD COMPRESS KNEE MED (MISCELLANEOUS) ×3 IMPLANT
SPLINT FIBERGLASS 3X35 (CAST SUPPLIES) IMPLANT
SPLINT FIBERGLASS 4X30 (CAST SUPPLIES) IMPLANT
SPLINT FINGER 3.25 911903 (SOFTGOODS) ×3 IMPLANT
STOCKINETTE 4X48 STRL (DRAPES) ×3 IMPLANT
STRIP CLOSURE SKIN 1/2X4 (GAUZE/BANDAGES/DRESSINGS) IMPLANT
SUCTION FRAZIER HANDLE 10FR (MISCELLANEOUS)
SUCTION TUBE FRAZIER 10FR DISP (MISCELLANEOUS) IMPLANT
SUT ETHIBOND 3-0 V-5 (SUTURE) IMPLANT
SUT ETHILON 4 0 PS 2 18 (SUTURE) IMPLANT
SUT MERSILENE 4 0 P 3 (SUTURE) ×3 IMPLANT
SUT MNCRL AB 4-0 PS2 18 (SUTURE) IMPLANT
SUT PROLENE 4 0 PS 2 18 (SUTURE) ×3 IMPLANT
SUT VIC AB 2-0 SH 27 (SUTURE)
SUT VIC AB 2-0 SH 27XBRD (SUTURE) IMPLANT
SUT VIC AB 3-0 FS2 27 (SUTURE) IMPLANT
SUT VICRYL 4-0 PS2 18IN ABS (SUTURE) IMPLANT
SYR BULB 3OZ (MISCELLANEOUS) ×3 IMPLANT
SYR CONTROL 10ML LL (SYRINGE) ×3 IMPLANT
TRAY DSU PREP LF (CUSTOM PROCEDURE TRAY) ×3 IMPLANT
TUBE CONNECTING 20'X1/4 (TUBING)
TUBE CONNECTING 20X1/4 (TUBING) IMPLANT
UNDERPAD 30X30 (UNDERPADS AND DIAPERS) ×3 IMPLANT

## 2018-07-13 NOTE — Op Note (Signed)
PREOPERATIVE DIAGNOSIS: Left index finger laceration with tendon involvement  POSTOPERATIVE DIAGNOSIS: Same  ATTENDING SURGEON: Dr. Iran Planas who scrubbed and present for the entire procedure  ASSISTANT SURGEON: None  ANESTHESIA: Local anesthetic with IV sedation  OPERATIVE PROCEDURE: #1: Left index finger extensor tendon repair, zone 1 #2: Left index finger laceration repair less than 3 cm  IMPLANTS: None  RADIOGRAPHIC INTERPRETATION: None  SURGICAL INDICATIONS: Patient is a right-hand-dominant female sustained a sharp laceration of the dorsal aspect left index finger.  Patient was seen and evaluated in the office and recommended undergo the above procedure.  Risks of surgery include but not limited to bleeding infection damage nearby nerves arteries or tendons loss of motion of the wrist and digits incomplete relief of symptoms and need for further surgical intervention.  SURGICAL TECHNIQUE: Patient was palpated and fine in the preoperative holding area marked with a permanent marker made on left index finger to indicate the correct operative site.  Patient brought back the operating room placed supine on the anesthesia table where the IV sedation was administered.  1% Xylocaine cortisone Marcaine local block was administered.  Patient tolerated this well.  A well-padded tourniquet placed on the left brachium and sealed with the appropriate drape.  Left upper extremities then prepped and draped normal sterile fashion.  A timeout was called the correct site was identified and the procedure then begun.  The transverse laceration that she sustained was then extended proximally distally.  Skin flaps were then raised and the patient did have a near complete laceration to the extensor mechanism at zone 1.  Following this the tendon was able to be mobilized there is good tendon distally therefore using 4-0 Mersilene the tendon was then repaired with a locking running suture as well as several  horizontal mattress sutures.  The wound was then thoroughly irrigated.  After primary extensor tendon repair the skin was then closed and the laceration repaired with Prolene suture.  Adaptic dressing and a sterile compressive bandage then applied.  The patient tolerated the procedure well was placed in a small finger splint taken recovery room in good condition.    Patient be seen back in the office next week for wound check likely suture removal down to see her therapist for static splint for zone 1 extensor tendon repair protocol.  No radiographs at the first visit.

## 2018-07-13 NOTE — Transfer of Care (Signed)
Immediate Anesthesia Transfer of Care Note  Patient: Danielle Peck  Procedure(s) Performed: WOUND EXPLORATION LEFT INDEX FINGER WITH TENDON REPAIR (Left Finger)  Patient Location: PACU  Anesthesia Type:General  Level of Consciousness: awake, alert , oriented and patient cooperative  Airway & Oxygen Therapy: Patient Spontanous Breathing and Patient connected to face mask oxygen  Post-op Assessment: Report given to RN and Post -op Vital signs reviewed and stable  Post vital signs: Reviewed and stable  Last Vitals:  Vitals Value Taken Time  BP    Temp    Pulse 72 07/13/2018  2:17 PM  Resp 17 07/13/2018  2:17 PM  SpO2 100 % 07/13/2018  2:17 PM  Vitals shown include unvalidated device data.  Last Pain:  Vitals:   07/13/18 1146  TempSrc: Oral  PainSc: 3       Patients Stated Pain Goal: 3 (00/51/10 2111)  Complications: No apparent anesthesia complications

## 2018-07-13 NOTE — Anesthesia Preprocedure Evaluation (Signed)
Anesthesia Evaluation  Patient identified by MRN, date of birth, ID band Patient awake    Reviewed: Allergy & Precautions, H&P , NPO status , Patient's Chart, lab work & pertinent test results  Airway Mallampati: II  TM Distance: >3 FB Neck ROM: Full    Dental no notable dental hx. (+) Teeth Intact, Dental Advisory Given   Pulmonary asthma , former smoker,    Pulmonary exam normal breath sounds clear to auscultation       Cardiovascular negative cardio ROS   Rhythm:Regular Rate:Normal     Neuro/Psych negative neurological ROS  negative psych ROS   GI/Hepatic negative GI ROS, Neg liver ROS,   Endo/Other  diabetes, Poorly Controlled, Oral Hypoglycemic Agents  Renal/GU negative Renal ROS  negative genitourinary   Musculoskeletal   Abdominal   Peds  Hematology negative hematology ROS (+)   Anesthesia Other Findings   Reproductive/Obstetrics negative OB ROS                             Anesthesia Physical  Anesthesia Plan  ASA: II and emergent  Anesthesia Plan: MAC   Post-op Pain Management:    Induction: Intravenous  PONV Risk Score and Plan: 2 and Ondansetron, Midazolam and Treatment may vary due to age or medical condition  Airway Management Planned: Simple Face Mask  Additional Equipment:   Intra-op Plan:   Post-operative Plan:   Informed Consent: I have reviewed the patients History and Physical, chart, labs and discussed the procedure including the risks, benefits and alternatives for the proposed anesthesia with the patient or authorized representative who has indicated his/her understanding and acceptance.     Dental advisory given  Plan Discussed with: CRNA  Anesthesia Plan Comments:         Anesthesia Quick Evaluation

## 2018-07-13 NOTE — Anesthesia Procedure Notes (Signed)
Procedure Name: MAC Date/Time: 07/13/2018 1:45 PM Performed by: Signe Colt, CRNA Pre-anesthesia Checklist: Patient identified, Emergency Drugs available, Suction available and Patient being monitored Patient Re-evaluated:Patient Re-evaluated prior to induction Oxygen Delivery Method: Simple face mask

## 2018-07-13 NOTE — Discharge Instructions (Signed)
KEEP BANDAGE CLEAN AND DRY CALL OFFICE FOR F/U APPT [phone] ini 8 days KEEP HAND ELEVATED ABOVE HEART OK TO APPLY ICE TO OPERATIVE AREA CONTACT OFFICE IF ANY WORSENING PAIN OR CONCERNS.   Post Anesthesia Home Care Instructions  Activity: Get plenty of rest for the remainder of the day. A responsible individual must stay with you for 24 hours following the procedure.  For the next 24 hours, DO NOT: -Drive a car -Paediatric nurse -Drink alcoholic beverages -Take any medication unless instructed by your physician -Make any legal decisions or sign important papers.  Meals: Start with liquid foods such as gelatin or soup. Progress to regular foods as tolerated. Avoid greasy, spicy, heavy foods. If nausea and/or vomiting occur, drink only clear liquids until the nausea and/or vomiting subsides. Call your physician if vomiting continues.  Special Instructions/Symptoms: Your throat may feel dry or sore from the anesthesia or the breathing tube placed in your throat during surgery. If this causes discomfort, gargle with warm salt water. The discomfort should disappear within 24 hours.  If you had a scopolamine patch placed behind your ear for the management of post- operative nausea and/or vomiting:  1. The medication in the patch is effective for 72 hours, after which it should be removed.  Wrap patch in a tissue and discard in the trash. Wash hands thoroughly with soap and water. 2. You may remove the patch earlier than 72 hours if you experience unpleasant side effects which may include dry mouth, dizziness or visual disturbances. 3. Avoid touching the patch. Wash your hands with soap and water after contact with the patch.

## 2018-07-13 NOTE — Anesthesia Postprocedure Evaluation (Signed)
Anesthesia Post Note  Patient: Danielle Peck  Procedure(s) Performed: WOUND EXPLORATION LEFT INDEX FINGER WITH TENDON REPAIR (Left Finger)     Patient location during evaluation: PACU Anesthesia Type: MAC Level of consciousness: awake and alert Pain management: pain level controlled Vital Signs Assessment: post-procedure vital signs reviewed and stable Respiratory status: spontaneous breathing, nonlabored ventilation and respiratory function stable Cardiovascular status: stable and blood pressure returned to baseline Postop Assessment: no apparent nausea or vomiting Anesthetic complications: no    Last Vitals:  Vitals:   07/13/18 1430 07/13/18 1500  BP: 128/82 (!) 143/83  Pulse: 79 80  Resp: 14 16  Temp:  36.7 C  SpO2: 100% 99%    Last Pain:  Vitals:   07/13/18 1500  TempSrc:   PainSc: 0-No pain                 Lynda Rainwater

## 2018-07-14 ENCOUNTER — Encounter (HOSPITAL_BASED_OUTPATIENT_CLINIC_OR_DEPARTMENT_OTHER): Payer: Self-pay | Admitting: Orthopedic Surgery

## 2019-06-17 ENCOUNTER — Ambulatory Visit: Payer: BLUE CROSS/BLUE SHIELD | Attending: Internal Medicine

## 2019-06-17 DIAGNOSIS — Z23 Encounter for immunization: Secondary | ICD-10-CM | POA: Insufficient documentation

## 2019-06-17 NOTE — Progress Notes (Signed)
   Covid-19 Vaccination Clinic  Name:  CALIYAH LEHRMAN    MRN: [id number] DOB: February 19, 1981  06/17/2019  Ms. Duren was observed post Covid-19 immunization for 15 minutes without incidence. She was provided with Vaccine Information Sheet and instruction to access the V-Safe system.   Ms. Usry was instructed to call 911 with any severe reactions post vaccine: Marland Kitchen Difficulty breathing  . Swelling of your face and throat  . A fast heartbeat  . A bad rash all over your body  . Dizziness and weakness    Immunizations Administered    Name Date Dose VIS Date Route   Pfizer COVID-19 Vaccine 06/17/2019 11:37 AM 0.3 mL 03/31/2019 Intramuscular   Manufacturer: [place]   Lot: [id number]   [place]: [id number]

## 2019-07-08 ENCOUNTER — Ambulatory Visit: Payer: BLUE CROSS/BLUE SHIELD

## 2019-07-11 ENCOUNTER — Ambulatory Visit: Payer: BLUE CROSS/BLUE SHIELD | Attending: Internal Medicine

## 2019-07-11 DIAGNOSIS — Z23 Encounter for immunization: Secondary | ICD-10-CM

## 2019-07-11 NOTE — Progress Notes (Signed)
   Covid-19 Vaccination Clinic  Name:  GEARL KINGHORN    MRN: [id number] DOB: 09-21-1980  07/11/2019  Ms. Ohman was observed post Covid-19 immunization for 15 minutes without incident. She was provided with Vaccine Information Sheet and instruction to access the V-Safe system.   Ms. Wirtanen was instructed to call 911 with any severe reactions post vaccine: Marland Kitchen Difficulty breathing  . Swelling of face and throat  . A fast heartbeat  . A bad rash all over body  . Dizziness and weakness   Immunizations Administered    Name Date Dose VIS Date Route   Pfizer COVID-19 Vaccine 07/11/2019  9:02 AM 0.3 mL 03/31/2019 Intramuscular   Manufacturer: [place]   Lot: [id number]   [place]: [id number]

## 2019-07-12 ENCOUNTER — Ambulatory Visit: Payer: BLUE CROSS/BLUE SHIELD

## 2019-08-23 ENCOUNTER — Other Ambulatory Visit: Payer: Self-pay

## 2019-08-23 ENCOUNTER — Ambulatory Visit: Payer: BLUE CROSS/BLUE SHIELD | Admitting: Dermatology

## 2019-08-23 ENCOUNTER — Encounter (INDEPENDENT_AMBULATORY_CARE_PROVIDER_SITE_OTHER): Payer: Self-pay

## 2019-08-23 ENCOUNTER — Encounter: Payer: Self-pay | Admitting: Dermatology

## 2019-08-23 DIAGNOSIS — E11620 Type 2 diabetes mellitus with diabetic dermatitis: Secondary | ICD-10-CM

## 2019-08-23 DIAGNOSIS — H01001 Unspecified blepharitis right upper eyelid: Secondary | ICD-10-CM

## 2019-08-23 MED ORDER — CEPHALEXIN 500 MG PO CAPS: 500.0000 mg | ORAL_CAPSULE | Freq: Two times a day (BID) | ORAL | 0 refills | Status: AC

## 2019-08-28 ENCOUNTER — Encounter: Payer: Self-pay | Admitting: Dermatology

## 2019-08-28 NOTE — Progress Notes (Signed)
   Follow-Up Visit   Subjective  Danielle Peck is a 39 y.o. female who presents for the following: Skin Problem (right jaw lump cyst? psoriasis on left leg humira previously ).  Lump Location: Front of right ear Duration: Several weeks Quality: Persists Associated Signs/Symptoms: Crusting ipsilateral upper eyelid Modifying Factors:  Severity:  Timing: Context:   The following portions of the chart were reviewed this encounter and updated as appropriate:     Objective  Well appearing patient in no apparent distress; mood and affect are within normal limits.  A focused examination was performed including Head and neck, regional lymph nodes, legs. Relevant physical exam findings are noted in the Assessment and Plan.   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   Ordered Medications: cephALEXin (KEFLEX) 500 MG capsule

## 2019-10-25 ENCOUNTER — Ambulatory Visit: Payer: BLUE CROSS/BLUE SHIELD | Admitting: Dermatology

## 2019-12-18 ENCOUNTER — Encounter: Payer: Self-pay | Admitting: Dermatology

## 2019-12-18 ENCOUNTER — Ambulatory Visit: Payer: BLUE CROSS/BLUE SHIELD | Admitting: Dermatology

## 2019-12-18 ENCOUNTER — Other Ambulatory Visit: Payer: Self-pay

## 2019-12-18 DIAGNOSIS — D229 Melanocytic nevi, unspecified: Secondary | ICD-10-CM

## 2019-12-18 DIAGNOSIS — L921 Necrobiosis lipoidica, not elsewhere classified: Secondary | ICD-10-CM

## 2019-12-18 DIAGNOSIS — L409 Psoriasis, unspecified: Secondary | ICD-10-CM | POA: Diagnosis not present

## 2019-12-18 DIAGNOSIS — D2239 Melanocytic nevi of other parts of face: Secondary | ICD-10-CM | POA: Diagnosis not present

## 2019-12-18 MED ORDER — HALOBETASOL PROPIONATE 0.05 % EX CREA: TOPICAL_CREAM | Freq: Two times a day (BID) | CUTANEOUS | 0 refills | Status: DC | PRN

## 2019-12-18 NOTE — Patient Instructions (Addendum)
3 issues addressed with [facility], date of birth Aug 31, 1980.  #1 was possible recurrence of her psoriasis on the bottom of both feet.  Examination showed scaly plaques without current pustules compatible with acral psoriasis.  She will use a class I potency topical ideally covered for 20 to 30 minutes with a moist cloth daily.  The prescription will read halobetasol but if this is not covered by her health plan it will be easy to substitute if either [place] or the pharmacist would call my office.  If she sees improvement she could back up on the use of moist cloth and simply use it daily after bathing.  Secondly she has seen significant change in a mole on the left cheekbone.  Examination showed a slightly crusted 5 mm dermal papule.  This will be biopsied on follow-up in the fall.  Finally we again discussed the chronic 3 inch x 5 inch plaque on her left shin which has a focal ulceration but no obvious infection.  Unfortunately, I did tell her that there has been no new therapy or FDA approved therapy for this but I will continue to search for possible treatments.

## 2020-01-20 ENCOUNTER — Encounter: Payer: Self-pay | Admitting: Dermatology

## 2020-01-20 NOTE — Progress Notes (Signed)
   Follow-Up Visit   Subjective  Danielle Peck is a 39 y.o. female who presents for the following: Follow-up (left shin---still have discoloration and mole check left chin--increase in size.) and Psoriasis (Left foot).  Necrobiosis left shin, psoriasis palms and soles Location:  Duration:  Quality:  Associated Signs/Symptoms: Modifying Factors:  Severity:  Timing: Context:   Objective  Well appearing patient in no apparent distress; mood and affect are within normal limits.  A focused examination was performed including Arms, hands, nails, legs, feet.. Relevant physical exam findings are noted in the Assessment and Plan.   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  Psoriasis (4) Right Thenar [place]; Left Mid Palm; Left Middle Plantar Surface; Right Middle Plantar Surface  Halobetasol or comparable class I topical corticosteroid covered with a moist wrap for 30 minutes daily.  Follow-up by phone in 1 month.  Nevus Head - Anterior (Face)  We will schedule time to biopsy.  3 issues addressed with [facility], date of birth Oct 22, 1980.  #1 was possible recurrence of her psoriasis on the bottom of both feet.  Examination showed scaly plaques without current pustules compatible with acral psoriasis.  She will use a class I potency topical ideally covered for 20 to 30 minutes with a moist cloth daily.  The prescription will read halobetasol but if this is not covered by her health plan it will be easy to substitute if either [place] or the pharmacist would call my office.  If she sees improvement she could back up on the use of moist cloth  and simply use it daily after bathing.  Secondly she has seen significant change in a mole on the left cheekbone.  Examination showed a slightly crusted 5 mm dermal papule.  This will be biopsied on follow-up in the fall.  Finally we again discussed the chronic 3 inch x 5 inch plaque on her left shin which has a focal ulceration but no obvious infection.  Unfortunately, I did tell her that there has been no new therapy or FDA approved therapy for this but I will continue to search for possible treatments.   I, Lavonna Monarch, MD, have reviewed all documentation for this visit.  The documentation on 01/20/20 for the exam, diagnosis, procedures, and orders are all accurate and complete.

## 2020-02-19 ENCOUNTER — Other Ambulatory Visit: Payer: Self-pay | Admitting: Dermatology

## 2020-02-26 ENCOUNTER — Ambulatory Visit: Payer: BLUE CROSS/BLUE SHIELD | Admitting: Dermatology

## 2020-07-03 ENCOUNTER — Other Ambulatory Visit: Payer: Self-pay | Admitting: Dermatology

## 2021-04-23 ENCOUNTER — Telehealth: Payer: Self-pay | Admitting: Dermatology

## 2021-04-23 NOTE — Telephone Encounter (Signed)
Call made to patient and she is aware a office visit needed for referral she is over due and nothing is noted in the 2021 visit. February appointment was already made and advised patient find pcp or urgent care can help with the referral also

## 2021-04-23 NOTE — Telephone Encounter (Signed)
Patient is calling to see if she can get a referral to a rheumatologist for her rheumatoid arthritis.  Per patient she does not have a primary care physician.

## 2021-05-02 NOTE — Progress Notes (Signed)
Office Visit Note  Patient: Danielle Peck             Date of Birth: Feb 03, 1981           MRN: [id number]             PCP: Patient, No Pcp Per (Inactive) Referring: No ref. provider found Visit Date: 05/07/2021 Occupation: @GUAROCC @  Subjective:  Psoriasis and joint pain  History of Present Illness: Danielle Peck is a 41 y.o. female came for evaluation of psoriatic arthritis and psoriasis.  Patient states that she developed psoriasis at the age of 36 which involved mostly her palms and her feet.  She was evaluated by Dr. Denna Haggard.  She was diagnosed with psoriasis.  She was initially treated with topical agents and then was tried on Humira.  Patient states she took Humira for 1 year and as it did not work she stopped the medication.  After that she moved to [facility] and was under care of another dermatologist who tried to [facility].  She states she tried Regulatory affairs officer for 4 months and then stopped it as it did not work.  The last time she took Stelara was 2 years ago.  She had been using topical agents since then.  Now she has moved to [place].  She has been experiencing pain and discomfort in her bilateral wrist joints, bilateral hands, bilateral ankles and her feet.  She has never been evaluated by rheumatologist.  She denies any discomfort in her lower back or SI joints.  There is no history of Achilles tendinitis, Planter fasciitis or uveitis.  She denies any shortness of breath.  She continues to have psoriasis on her hands and her feet.  There is no known family history of psoriasis or psoriatic arthritis.  She is gravida 0, para 0, no history of DVTs.  Activities of Daily Living:  Patient reports morning stiffness for 30 minutes.   Patient Reports nocturnal pain.  Difficulty dressing/grooming: Denies Difficulty climbing stairs: Denies Difficulty getting out of chair: Denies Difficulty using hands for taps, buttons, cutlery, and/or writing: Reports  Review of Systems  Constitutional:   Positive for fatigue.  HENT:  Negative for mouth sores, mouth dryness and nose dryness.   Eyes:  Negative for pain, itching and dryness.  Respiratory:  Negative for shortness of breath and difficulty breathing.   Cardiovascular:  Negative for chest pain and palpitations.  Gastrointestinal:  Negative for blood in stool, constipation and diarrhea.  Endocrine: Negative for increased urination.  Genitourinary:  Negative for difficulty urinating.  Musculoskeletal:  Positive for joint pain, joint pain, joint swelling and morning stiffness. Negative for myalgias, muscle tenderness and myalgias.  Skin:  Positive for rash. Negative for color change.  Allergic/Immunologic: Positive for susceptible to infections.  Neurological:  Positive for headaches and weakness. Negative for dizziness, numbness and memory loss.  Hematological:  Negative for bruising/bleeding tendency.  Psychiatric/Behavioral:  Negative for confusion.    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   Breast abscess 07/03/2016    Past Medical History:  Diagnosis Date   Asthma    Diabetes mellitus without complication (HCC)    HSV-2 infection    Non-healing wound of lower extremity 2018   Psoriasis     Family History  Problem Relation Age of Onset  Hypertension Mother    Alopecia Mother    Past Surgical History:  Procedure Laterality Date   APPENDECTOMY     BREAST MASS EXCISION Right    INCISION AND DRAINAGE OF WOUND Right 07/03/2016   Procedure: IRRIGATION AND DEBRIDEMENT WOUND;  Surgeon: Ralene Ok, MD;  Location: [place];  Service: General;  Laterality: Right;   WOUND EXPLORATION Left 07/13/2018   Procedure: WOUND EXPLORATION LEFT INDEX FINGER WITH TENDON REPAIR;  Surgeon: Iran Planas, MD;   Location: [place];  Service: Orthopedics;  Laterality: Left;   Social History   Social History Narrative   Not on file   Immunization History  Administered Date(s) Administered   PFIZER(Purple Top)SARS-COV-2 Vaccination 06/17/2019, 07/11/2019     Objective: Vital Signs: BP 117/77 (BP Location: Right Arm, Patient Position: Sitting, Cuff Size: Normal)    Pulse 98    Ht 5' 3.75" (1.619 m)    Wt 191 lb (86.6 kg)    BMI 33.04 kg/m    Physical Exam Vitals and nursing note reviewed.  Constitutional:      Appearance: She is well-developed.  HENT:     Head: Normocephalic and atraumatic.  Eyes:     Conjunctiva/sclera: Conjunctivae normal.  Cardiovascular:     Rate and Rhythm: Normal rate and regular rhythm.     Heart sounds: Normal heart sounds.  Pulmonary:     Effort: Pulmonary effort is normal.     Breath sounds: Normal breath sounds.  Abdominal:     General: Bowel sounds are normal.     Palpations: Abdomen is soft.  Musculoskeletal:     Cervical back: Normal range of motion.  Lymphadenopathy:     Cervical: No cervical adenopathy.  Skin:    General: Skin is warm and dry.     Capillary Refill: Capillary refill takes less than 2 seconds.     Comments: Psoriasis lesions were noted on bilateral palmar and plantar surface.  Nail dystrophy was noted.  Neurological:     Mental Status: She is alert and oriented to person, place, and time.  Psychiatric:        Behavior: Behavior normal.     Musculoskeletal Exam: C-spine thoracic and lumbar spine with good range of motion.  She had no SI joint tenderness.  Shoulder joints, elbow joints, wrist joints, MCPs PIPs and DIPs with good range of motion.  She had limitation of her left PIP and left DIP flexion due to previous injury.  Surgical scar was noted over the dorsal aspect of her left index and middle finger.  Hip joints and knee joints with good range of motion with no synovitis.  She had tenderness over bilateral ankle  joints but no synovitis was noted.  She had tenderness on palpation of her left plantar fascial.  She had no MTP PIP or DIP tenderness.  She had callus formation under bilateral metatarsals.  CDAI Exam: CDAI Score: -- Patient Global: --; Provider Global: -- Swollen: --; Tender: -- Joint Exam 05/07/2021   No joint exam has been documented for this visit   There is currently no information documented on the homunculus. Go to the Rheumatology activity and complete the homunculus joint exam.  Investigation: No additional findings.  Imaging: XR Foot 2 Views Left  Result Date: 05/07/2021 No MTP, PIP or DIP narrowing was noted.  No intertarsal, tibiotalar or subtalar joint space narrowing was noted.  No erosive changes were noted. Impression: Unremarkable x-ray of the foot.  XR Foot 2 Views Right  Result Date: 05/07/2021 No MTP, PIP or DIP narrowing was noted.  No intertarsal, tibiotalar or subtalar joint space narrowing was noted.  No erosive changes were noted. Impression: Unremarkable x-ray of the foot.  XR Hand 2 View Left  Result Date: 05/07/2021 No MCP, PIP or DIP narrowing was noted.  No intercarpal or radiocarpal joint space narrowing was noted.  No erosive changes were noted. Impression: Unremarkable x-ray of the hand.  XR Hand 2 View Right  Result Date: 05/07/2021 No MCP, PIP or DIP narrowing was noted.  No intercarpal or radiocarpal joint space narrowing was noted.  No erosive changes were noted. Impression: Unremarkable x-ray of the hand.   Recent Labs: Lab Results  Component Value Date   WBC 8.9 06/30/2018   HGB 14.6 06/30/2018   PLT 254 06/30/2018   NA 131 (L) 07/12/2018   K 4.1 07/12/2018   CL 102 07/12/2018   CO2 21 (L) 07/12/2018   GLUCOSE 291 (H) 07/12/2018   BUN 13 07/12/2018   CREATININE 0.45 07/12/2018   BILITOT 0.4 07/03/2016   ALKPHOS 69 07/03/2016   AST 19 07/03/2016   ALT 15 07/03/2016   PROT 7.0 07/03/2016   ALBUMIN 3.8 07/03/2016   CALCIUM 9.2  07/12/2018   GFRAA >60 07/12/2018    Speciality Comments: No specialty comments available.  Procedures:  No procedures performed Allergies: Cat hair extract, [place] grass extract, Dust mite extract, and Tape   Assessment / Plan:     Visit Diagnoses: Psoriatic arthritis ([place]) -patient complains of pain and discomfort in multiple joints especially her hands and her feet and ankles.  She notices intermittent swelling in her ankles.  No synovitis was noted today on examination.  She had limited flexion of her left index finger and tenderness over left index finger PIP and DIP joints.  She also had tenderness on palpation of bilateral ankle joints.  She had tenderness over left plantar fascia.  Plan: Sedimentation rate.  Different treatment options and their side effects were discussed.  Patient has tried Humira in the past for about 1 year and discontinued due to inadequate response.  She also tried Stelara about 2 years ago for 4 months and discontinued due to inadequate response.  I had a detailed discussion regarding different treatment options and their side effects.  Patient and her parents (were on the phone briefly) but interested on possible use of [place] 38 medication use.  I advised that it is not approved for psoriatic arthritis yet.  Indications side effects contraindications of Taltz were discussed.  Patient was in agreement to proceed with the medication.  I will apply for the medication.  Medication counseling:  Baseline Immunosuppressant Therapy Labs TB GOLD pending   Hepatitis Panel pending   HIV Lab Results  Component Value Date   HIV Non Reactive 07/04/2016   Immunoglobulins   SPEP Serum Protein Electrophoresis Latest Ref Rng & Units 07/03/2016  Total Protein 6.5 - 8.1 g/dL 7.0    Does patient have a history of inflammatory bowel disease? No  Counseled patient that Donnetta Hail is a IL-17 inhibitor that works to reduce pain and inflammation associated with arthritis.  Counseled  patient on purpose, proper use, and adverse effects of Taltz. Reviewed the most common adverse effects of infection, inflammatory bowel disease, and allergic reaction. Counseled patient that Donnetta Hail should be held for infection and prior to scheduled surgery.  Counseled patient to avoid live vaccines while on Taltz.  Advised patient to get annual influenza vaccine, pneumococcal vaccine,  and Shingrix as indicated.  Reviewed storage information for Taltz.  Reviewed the importance of regular labs while on [place]. Standing orders placed and is to return in 1 month and then every 3 months after initiation.  Provided patient with medication education material and answered all questions.  Patient consented to [place].  Will upload consent into patient's chart.  Will apply for Taltz through patient's insurance and update when we receive a response.  Advised initial injection must be administered in office.  Patient voiced understanding.    Taltz dose will be: For psoriatic arthritis and plaque psoriasis overlap load of 160 mg then 80 mg on weeks 2,4,6,8,10,12 then 80 mg every 28 days  Prescription will be sent to pharmacy pending lab results and insurance approval.   Psoriasis-she does show psoriasis on her bilateral hands and her bilateral feet.  I will send a prescription for clobetasol cream.  Usage and side effects were discussed  High risk medication use - Plan: CBC with Differential/Platelet, COMPLETE METABOLIC PANEL WITH GFR, Hepatitis B surface antigen, Hepatitis B core antibody, IgM, Hepatitis C antibody, QuantiFERON-TB Gold Plus, Serum protein electrophoresis with reflex, IgG, IgA, IgM, HIV Antibody (routine testing w rflx)  Pain in both hands -she complains of pain and stiffness in her bilateral hands.  No synovitis was noted.  She had tenderness over left index finger PIP and DIP joint.  She had limited range of motion.  She had previous injury to the digit.  Plan: XR Hand 2 View Right, XR Hand 2 View Left,  x-rays of bilateral hands were unremarkable.  Rheumatoid factor, Cyclic citrul peptide antibody, IgG, ANA  Chronic pain of both ankles -she complains of pain and swelling in her bilateral ankle joints.  I did not appreciate synovitis.  She states the swelling comes and goes.  She has difficulty walking when the ankles are swollen.   Chronic pain of both feet -she has discomfort in her MTPs but no synovitis was noted.  She had calluses under her metatarsals.  Plan: XR Foot 2 Views Right, XR Foot 2 Views Left.  X-rays of bilateral feet were unremarkable.  Pes cavus of both feet-she has bilateral pes cavus.  Proper fitting shoes with arch support were discussed.  Plantar fasciitis of left foot-she had tenderness over left plantar fascial.  History of diabetes mellitus-she is on Levemir  Dyslipidemia-she is on atorvastatin  History of asthma-she uses albuterol inhaler.  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  Necrobiosis lipoidica - Left lower extremity evaluated by Dr. Denna Haggard in August 2021  Other fatigue -gives history of fatigue.  Plan: CK  Orders: Orders Placed This Encounter  Procedures   XR Hand 2 View Right   XR Hand 2 View Left   XR Foot 2 Views Right   XR Foot 2 Views Left   CBC with Differential/Platelet   COMPLETE METABOLIC PANEL WITH GFR   Sedimentation rate   CK   Rheumatoid factor   Cyclic citrul peptide antibody, IgG   ANA   Hepatitis B surface antigen   Hepatitis B core antibody, IgM   Hepatitis C antibody   QuantiFERON-TB Gold Plus   Serum protein electrophoresis with reflex   IgG, IgA, IgM   No orders of the defined types were placed in this encounter.    Follow-Up Instructions: Return for Psoriatic arthritis.   Bo Merino, MD  Note - This record has been created using Editor, commissioning.  Chart creation errors have  been sought, but may not always  have been located.  Such creation errors do not reflect on  the standard of medical care.

## 2021-05-07 ENCOUNTER — Other Ambulatory Visit: Payer: Self-pay

## 2021-05-07 ENCOUNTER — Ambulatory Visit: Payer: Self-pay

## 2021-05-07 ENCOUNTER — Encounter: Payer: Self-pay | Admitting: Rheumatology

## 2021-05-07 ENCOUNTER — Telehealth: Payer: Self-pay

## 2021-05-07 ENCOUNTER — Ambulatory Visit: Payer: 59 | Admitting: Rheumatology

## 2021-05-07 VITALS — BP 117/77 | HR 98 | Ht 63.75 in | Wt 191.0 lb

## 2021-05-07 DIAGNOSIS — M79642 Pain in left hand: Secondary | ICD-10-CM | POA: Diagnosis not present

## 2021-05-07 DIAGNOSIS — M722 Plantar fascial fibromatosis: Secondary | ICD-10-CM

## 2021-05-07 DIAGNOSIS — M79671 Pain in right foot: Secondary | ICD-10-CM | POA: Diagnosis not present

## 2021-05-07 DIAGNOSIS — L4050 Arthropathic psoriasis, unspecified: Secondary | ICD-10-CM | POA: Diagnosis not present

## 2021-05-07 DIAGNOSIS — L409 Psoriasis, unspecified: Secondary | ICD-10-CM

## 2021-05-07 DIAGNOSIS — M79672 Pain in left foot: Secondary | ICD-10-CM | POA: Diagnosis not present

## 2021-05-07 DIAGNOSIS — Q6671 Congenital pes cavus, right foot: Secondary | ICD-10-CM

## 2021-05-07 DIAGNOSIS — N611 Abscess of the breast and nipple: Secondary | ICD-10-CM

## 2021-05-07 DIAGNOSIS — Z8709 Personal history of other diseases of the respiratory system: Secondary | ICD-10-CM

## 2021-05-07 DIAGNOSIS — L921 Necrobiosis lipoidica, not elsewhere classified: Secondary | ICD-10-CM

## 2021-05-07 DIAGNOSIS — M25571 Pain in right ankle and joints of right foot: Secondary | ICD-10-CM

## 2021-05-07 DIAGNOSIS — R5383 Other fatigue: Secondary | ICD-10-CM

## 2021-05-07 DIAGNOSIS — M25572 Pain in left ankle and joints of left foot: Secondary | ICD-10-CM

## 2021-05-07 DIAGNOSIS — M79641 Pain in right hand: Secondary | ICD-10-CM | POA: Diagnosis not present

## 2021-05-07 DIAGNOSIS — Z8639 Personal history of other endocrine, nutritional and metabolic disease: Secondary | ICD-10-CM | POA: Insufficient documentation

## 2021-05-07 DIAGNOSIS — Z79899 Other long term (current) drug therapy: Secondary | ICD-10-CM | POA: Diagnosis not present

## 2021-05-07 DIAGNOSIS — G8929 Other chronic pain: Secondary | ICD-10-CM

## 2021-05-07 DIAGNOSIS — E785 Hyperlipidemia, unspecified: Secondary | ICD-10-CM

## 2021-05-07 DIAGNOSIS — Q6672 Congenital pes cavus, left foot: Secondary | ICD-10-CM

## 2021-05-07 DIAGNOSIS — F900 Attention-deficit hyperactivity disorder, predominantly inattentive type: Secondary | ICD-10-CM

## 2021-05-07 MED ORDER — CLOBETASOL PROPIONATE 0.05 % EX CREA: 1.0000 "application " | TOPICAL_CREAM | Freq: Two times a day (BID) | CUTANEOUS | 0 refills | Status: AC

## 2021-05-07 NOTE — Patient Instructions (Addendum)
Ixekizumab injection What is this medication? IXEKIZUMAB (ix e KIZ ue mab) is used to treat plaque psoriasis, psoriatic arthritis, ankylosing spondylitis, and active non-radiographic axial spondyloarthritis. This medicine may be used for other purposes; ask your health care provider or pharmacist if you have questions. COMMON BRAND NAME(S): TALTZ What should I tell my care team before I take this medication? They need to know if you have any of these conditions: immune system problems infection (especially a viral infection such as chickenpox, cold sores, or herpes) recently received or are scheduled to receive a vaccine tuberculosis, a positive skin test for tuberculosis, or have recently been in close contact with someone who has tuberculosis an unusual or allergic reaction to ixekizumab, other medicines, foods, dyes or preservatives pregnant or trying to get pregnant breast-feeding How should I use this medication? This medicine is for injection under the skin. It may be administered by a healthcare professional in a hospital or clinic setting or at home. If you get this medicine at home, you will be taught how to prepare and give this medicine. Use exactly as directed. Take your medicine at regular intervals. Do not take your medicine more often than directed. It is important that you put your used needles and syringes in a special sharps container. Do not put them in a trash can. If you do not have a sharps container, call your pharmacist or healthcare provider to get one. A special MedGuide will be given to you by the pharmacist with each prescription and refill. Be sure to read this information carefully each time. Talk to your pediatrician regarding the use of this medicine in children. While this drug may be prescribed for children as young as 6 years for selected conditions, precautions do apply. Overdosage: If you think you have taken too much of this medicine contact a poison control  center or emergency room at once. NOTE: This medicine is only for you. Do not share this medicine with others. What if I miss a dose? It is important not to miss your dose. Call your doctor of health care professional if you are unable to keep an appointment. If you give yourself the medicine and you miss a dose, take it as soon as you can. Then be sure to take your next doses on your regular schedule. Do not take double or extra doses. If you have questions about a missed injection, call your health care professional. What may interact with this medication? Do not take this medicine with any of the following medications: live virus vaccines This medicine may also interact with the following medications: inactivated vaccines This list may not describe all possible interactions. Give your health care provider a list of all the medicines, herbs, non-prescription drugs, or dietary supplements you use. Also tell them if you smoke, drink alcohol, or use illegal drugs. Some items may interact with your medicine. What should I watch for while using this medication? Tell your doctor or healthcare professional if your symptoms do not start to get better or if they get worse. You will be tested for tuberculosis (TB) before you start this medicine. If your doctor prescribes any medicine for TB, you should start taking the TB medicine before starting this medicine. Make sure to finish the full course of TB medicine. Call your doctor or healthcare professional for advice if you get a fever, chills or sore throat, or other symptoms of a cold or flu. Do not treat yourself. This drug decreases your body's ability to  fight infections. Try to avoid being around people who are sick. This medicine can decrease the response to a vaccine. If you need to get vaccinated, tell your healthcare professional if you have received this medicine within the last 6 months. Extra booster doses may be needed. Talk to your doctor to see  if a different vaccination schedule is needed. What side effects may I notice from receiving this medication? Side effects that you should report to your doctor or health care professional as soon as possible: allergic reactions like skin rash, itching or hives, swelling of the face, lips, or tongue signs and symptoms of infection like fever or chills; cough; sore throat; pain or trouble passing urine signs and symptoms of bowel problems like abdominal pain, diarrhea, blood in the stool, and weight loss white patches in the mouth or throat vaginal discharge, itching, or odor in women Side effects that usually do not require medical attention (report to your doctor or health care professional if they continue or are bothersome): nausea runny nose sinus trouble This list may not describe all possible side effects. Call your doctor for medical advice about side effects. You may report side effects to FDA at 1-800-FDA-1088. Where should I keep my medication? Keep out of the reach of children. Store the prefilled syringe or injection pen in a refrigerator between 2 to 8 degrees C (36 to 46 degrees F). Keep the syringe or the pen in the original carton until ready for use. Protect from light. Do not freeze. Do not shake. Prior to use, remove the syringe or pen from the refrigerator and use within 30 minutes. Throw away any unused medicine after the expiration date on the label. NOTE: This sheet is a summary. It may not cover all possible information. If you have questions about this medicine, talk to your doctor, pharmacist, or health care provider.  2022 Elsevier/Gold Standard (2020-12-24 00:00:00) Vaccines You are taking a medication(s) that can suppress your immune system.  The following immunizations are recommended: Flu annually Covid-19  Td/Tdap (tetanus, diphtheria, pertussis) every 10 years Pneumonia (Prevnar 15 then Pneumovax 23 at least 1 year apart.  Alternatively, can take Prevnar 20  without needing additional dose) Shingrix: 2 doses from 4 weeks to 6 months apart  Please check with your PCP to make sure you are up to date.   If you have signs or symptoms of an infection or start antibiotics: First, call your PCP for workup of your infection. Hold your medication through the infection, until you complete your antibiotics, and until symptoms resolve if you take the following: Injectable medication (Actemra, Benlysta, Cimzia, Cosentyx, Enbrel, Humira, Kevzara, Orencia, Remicade, Simponi, Stelara, Taltz, Tremfya) Methotrexate Leflunomide (Arava) Mycophenolate (Cellcept) Morrie Sheldon, Olumiant, or Rinvoq

## 2021-05-07 NOTE — Telephone Encounter (Signed)
Please apply for taltz, per Dr. Estanislado Pandy. Thanks!   Consent obtained and sent to the scan center.

## 2021-05-08 NOTE — Telephone Encounter (Signed)
Please start [place].  Dose: For psoriatic arthritis and plaque psoriasis overlap load of 160 mg then 80 mg on weeks 2,4,6,8,10,12 then 80 mg every 28 days   Dx: Psoriatic arthritis (L40.5) and Psoriasis (L40.9)  Previously tried therapies: Humira - inadequate response Stelara - inadequate response  Once approved, patient is eligible for copay card since she has Pharmacist, community.  Knox Saliva, PharmD, MPH, BCPS Clinical Pharmacist (Rheumatology and Pulmonology)

## 2021-05-08 NOTE — Progress Notes (Signed)
Please notify patient that glucose is elevated at 244.  Other labs are pending.  We will discuss results at the follow-up visit.

## 2021-05-10 LAB — COMPLETE METABOLIC PANEL WITH GFR
AG Ratio: 1.8 (calc) (ref 1.0–2.5)
ALT: 13 U/L (ref 6–29)
AST: 10 U/L (ref 10–30)
Albumin: 4.6 g/dL (ref 3.6–5.1)
Alkaline phosphatase (APISO): 54 U/L (ref 31–125)
BUN/Creatinine Ratio: 37 (calc) — ABNORMAL HIGH (ref 6–22)
BUN: 17 mg/dL (ref 7–25)
CO2: 19 mmol/L — ABNORMAL LOW (ref 20–32)
Calcium: 9.6 mg/dL (ref 8.6–10.2)
Chloride: 98 mmol/L (ref 98–110)
Creat: 0.46 mg/dL — ABNORMAL LOW (ref 0.50–0.97)
Globulin: 2.6 g/dL (calc) (ref 1.9–3.7)
Glucose, Bld: 244 mg/dL — ABNORMAL HIGH (ref 65–99)
Potassium: 4.6 mmol/L (ref 3.5–5.3)
Sodium: 134 mmol/L — ABNORMAL LOW (ref 135–146)
Total Bilirubin: 0.6 mg/dL (ref 0.2–1.2)
Total Protein: 7.2 g/dL (ref 6.1–8.1)
eGFR: 126 mL/min/{1.73_m2} (ref >=60)

## 2021-05-10 LAB — ANA: Anti Nuclear Antibody (ANA): POSITIVE — AB

## 2021-05-10 LAB — CBC WITH DIFFERENTIAL/PLATELET
Absolute Monocytes: 684 cells/uL (ref 200–950)
Basophils Absolute: 65 cells/uL (ref 0–200)
Basophils Relative: 0.5 %
Eosinophils Absolute: 284 cells/uL (ref 15–500)
Eosinophils Relative: 2.2 %
HCT: 42.9 % (ref 35.0–45.0)
Hemoglobin: 14.9 g/dL (ref 11.7–15.5)
Lymphs Abs: 3431 cells/uL (ref 850–3900)
MCH: 31.7 pg (ref 27.0–33.0)
MCHC: 34.7 g/dL (ref 32.0–36.0)
MCV: 91.3 fL (ref 80.0–100.0)
MPV: 11.3 fL (ref 7.5–12.5)
Monocytes Relative: 5.3 %
Neutro Abs: 8437 cells/uL — ABNORMAL HIGH (ref 1500–7800)
Neutrophils Relative %: 65.4 %
Platelets: 337 10*3/uL (ref 140–400)
RBC: 4.7 10*6/uL (ref 3.80–5.10)
RDW: 12.1 % (ref 11.0–15.0)
Total Lymphocyte: 26.6 %
WBC: 12.9 10*3/uL — ABNORMAL HIGH (ref 3.8–10.8)

## 2021-05-10 LAB — SEDIMENTATION RATE: Sed Rate: 6 mm/h (ref 0–20)

## 2021-05-10 LAB — QUANTIFERON-TB GOLD PLUS
Mitogen-NIL: >10 IU/mL
NIL: 0.03 IU/mL
QuantiFERON-TB Gold Plus: NEGATIVE
TB1-NIL: 0.07 IU/mL
TB2-NIL: 0.06 IU/mL

## 2021-05-10 LAB — IGG, IGA, IGM
IgG (Immunoglobin G), Serum: 1054 mg/dL (ref 600–1640)
IgM, Serum: 228 mg/dL (ref 50–300)
Immunoglobulin A: 231 mg/dL (ref 47–310)

## 2021-05-10 LAB — HEPATITIS B CORE ANTIBODY, IGM: Hep B C IgM: NONREACTIVE

## 2021-05-10 LAB — CK: Total CK: 44 U/L (ref 29–143)

## 2021-05-10 LAB — PROTEIN ELECTROPHORESIS, SERUM, WITH REFLEX
Albumin ELP: 4.5 g/dL (ref 3.8–4.8)
Alpha 1: 0.3 g/dL (ref 0.2–0.3)
Alpha 2: 0.7 g/dL (ref 0.5–0.9)
Beta 2: 0.3 g/dL (ref 0.2–0.5)
Beta Globulin: 0.5 g/dL (ref 0.4–0.6)
Gamma Globulin: 1 g/dL (ref 0.8–1.7)
Total Protein: 7.3 g/dL (ref 6.1–8.1)

## 2021-05-10 LAB — HEPATITIS C ANTIBODY
Hepatitis C Ab: NONREACTIVE
SIGNAL TO CUT-OFF: 0.07 (ref <1.00)

## 2021-05-10 LAB — RHEUMATOID FACTOR: Rheumatoid fact SerPl-aCnc: <14 IU/mL (ref <14)

## 2021-05-10 LAB — CYCLIC CITRUL PEPTIDE ANTIBODY, IGG: Cyclic Citrullin Peptide Ab: <16 UNITS

## 2021-05-10 LAB — ANTI-NUCLEAR AB-TITER (ANA TITER): ANA Titer 1: 1:40 {titer} — ABNORMAL HIGH

## 2021-05-10 LAB — HEPATITIS B SURFACE ANTIGEN: Hepatitis B Surface Ag: NONREACTIVE

## 2021-05-15 NOTE — Telephone Encounter (Signed)
Submitted a Prior Authorization request to [facility] for TALTZ via CoverMyMeds. Will update once we receive a response.  Key: [id number] PA Case ID: [id number]  Patient will likely be denied - preferred options are the following and patient must try at least 7 based on most recent formulary: Cosentyx, Enbrel, Humira, Rutherford Nail, Rinvoq, Skyrizi, Stelara syringe, Xeljanz/Xeljanz XR  Of the above, patient has tried two: Humira x 1 year and Stelara (inadequate response)  Knox Saliva, PharmD, MPH, BCPS Clinical Pharmacist (Rheumatology and Pulmonology)

## 2021-05-16 ENCOUNTER — Telehealth: Payer: Self-pay | Admitting: Pharmacist

## 2021-05-16 NOTE — Telephone Encounter (Signed)
Received a fax regarding Prior Authorization from [place] for [place]. Authorization has been DENIED because: preferred products for the patient's health plan are Cosentyx, Enbrel, Humira, Rutherford Nail, Rinvoq, Skyrizi, Stelara syringe, and Xeljanz/Xeljanz XR for psoriatic arthritis.  Patient must try at least 5 of the preferred products or have reasons why she cannot take them  Knox Saliva, PharmD, MPH, BCPS Clinical Pharmacist (Rheumatology and Pulmonology)

## 2021-05-16 NOTE — Telephone Encounter (Signed)
Please start Cosentyx BIV.  Dose: 300 mg every 7 days for 5 weeks then 300 mg every 28 days  Dx: Psoriatic arthritis (L40.5) and Psoriasis (L40.9)  Previously tried therapies: Humira - inadequate response Stelara - inadequate response  Knox Saliva, PharmD, MPH, BCPS Clinical Pharmacist (Rheumatology and Pulmonology)

## 2021-05-16 NOTE — Telephone Encounter (Signed)
Submitted an URGENT Prior Authorization request to [facility] for COSENTYX via CoverMyMeds. Will update once we receive a response.  Key: Darrall Dears, PharmD, MPH, BCPS Clinical Pharmacist (Rheumatology and Pulmonology)

## 2021-05-16 NOTE — Telephone Encounter (Signed)
Yes, okay to apply for Cosentyx

## 2021-05-19 ENCOUNTER — Other Ambulatory Visit (HOSPITAL_COMMUNITY): Payer: Self-pay

## 2021-05-19 NOTE — Telephone Encounter (Signed)
Attempted to contact the patient and left message for patient to call the office.  

## 2021-05-19 NOTE — Telephone Encounter (Signed)
Received notification from [facility] regarding a prior authorization for [place]. Authorization has been APPROVED from 05/16/21 to 05/16/22  Unable to run test claim since patient must fill through [place]: [place] # [phone]  Enrolled patient into copay card:  BIN: [id number] PCN: [id number] GRP: [id number] ID: [id number]  Called patient to review that Donnetta Hail was denied and Cosentyx falls into same class. She inquired about drug-disease interaction with her diabetes and risk of COVID. I reviwed that diabetes puts her at risk for infection specifically if diabetes is uncontrolled. 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 I advised that medication use is risk vs benefit b/c it is considered immunosuppressive placing her at increased risk for infection, but uncontrolled PsA/psoriasis will likely also affect her ability to work and complete daily functions. She confirmed this is true. She recalls being on Humira and "always being sick" and does not want to go through a similar experience.  She states she would like to further think about starting Cosentyx and will reach back out to our clinic with decision to move forward with Cosentyx.  Knox Saliva, PharmD, MPH, BCPS Clinical Pharmacist (Rheumatology and Pulmonology)

## 2021-05-20 ENCOUNTER — Encounter: Payer: Self-pay | Admitting: *Deleted

## 2021-05-20 NOTE — Telephone Encounter (Signed)
Sent patient a my chart message to advise patient to contact the office if she would like a sooner appointment to discuss Cosentyx.

## 2021-06-03 NOTE — Progress Notes (Deleted)
Office Visit Note  Patient: Danielle Peck             Date of Birth: [birth year]           MRN: [id number]             PCP: Patient, No Pcp Per (Inactive) Referring: No ref. provider found Visit Date: 06/17/2021 Occupation: @GUAROCC @  Subjective:  No chief complaint on file.   History of Present Illness: Danielle Peck is a 41 y.o. female ***   Activities of Daily Living:  Patient reports morning stiffness for *** {minute/hour:19697}.   Patient {ACTIONS;DENIES/REPORTS:21021675::"Denies"} nocturnal pain.  Difficulty dressing/grooming: {ACTIONS;DENIES/REPORTS:21021675::"Denies"} Difficulty climbing stairs: {ACTIONS;DENIES/REPORTS:21021675::"Denies"} Difficulty getting out of chair: {ACTIONS;DENIES/REPORTS:21021675::"Denies"} Difficulty using hands for taps, buttons, cutlery, and/or writing: {ACTIONS;DENIES/REPORTS:21021675::"Denies"}  No Rheumatology ROS completed.   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   Breast abscess 07/03/2016    Past Medical History:  Diagnosis Date   Asthma    Diabetes mellitus without complication (HCC)    HSV-2 infection    Non-healing wound of lower extremity 2018   Psoriasis     Family History  Problem Relation Age of Onset   Hypertension Mother    Alopecia Mother    Past Surgical History:  Procedure Laterality Date   APPENDECTOMY     BREAST MASS EXCISION Right    INCISION AND DRAINAGE OF WOUND Right 07/03/2016   Procedure: IRRIGATION AND DEBRIDEMENT WOUND;  Surgeon: Ralene Ok, MD;  Location: [facility];  Service: General;  Laterality: Right;   WOUND EXPLORATION Left 07/13/2018   Procedure: WOUND EXPLORATION LEFT INDEX FINGER WITH TENDON REPAIR;  Surgeon: Iran Planas, MD;  Location: [facility];   Service: Orthopedics;  Laterality: Left;   Social History   Social History Narrative   Not on file   Immunization History  Administered Date(s) Administered   PFIZER(Purple Top)SARS-COV-2 Vaccination 06/17/2019, 07/11/2019     Objective: Vital Signs: There were no vitals taken for this visit.   Physical Exam   Musculoskeletal Exam: ***  CDAI Exam: CDAI Score: -- Patient Global: --; Provider Global: -- Swollen: --; Tender: -- Joint Exam 06/17/2021   No joint exam has been documented for this visit   There is currently no information documented on the homunculus. Go to the Rheumatology activity and complete the homunculus joint exam.  Investigation: No additional findings.  Imaging: XR Foot 2 Views Left  Result Date: 05/07/2021 No MTP, PIP or DIP narrowing was noted.  No intertarsal, tibiotalar or subtalar joint space narrowing was noted.  No erosive changes were noted. Impression: Unremarkable x-ray of the foot.  XR Foot 2 Views Right  Result Date: 05/07/2021 No MTP, PIP or DIP narrowing was noted.  No intertarsal, tibiotalar or subtalar joint space narrowing was noted.  No erosive changes were noted. Impression: Unremarkable x-ray of the foot.  XR Hand 2 View Left  Result Date: 05/07/2021 No MCP, PIP or DIP narrowing was noted.  No intercarpal or radiocarpal joint space narrowing was noted.  No erosive changes were noted. Impression: Unremarkable x-ray of the hand.  XR Hand 2 View Right  Result Date: 05/07/2021 No MCP, PIP or DIP narrowing was noted.  No intercarpal or radiocarpal joint space narrowing was noted.  No erosive changes were noted. Impression: Unremarkable x-ray  of the hand.   Recent Labs: Lab Results  Component Value Date   WBC 12.9 (H) 05/07/2021   HGB 14.9 05/07/2021   PLT 337 05/07/2021   NA 134 (L) 05/07/2021   K 4.6 05/07/2021   CL 98 05/07/2021   CO2 19 (L) 05/07/2021   GLUCOSE 244 (H) 05/07/2021   BUN 17 05/07/2021   CREATININE 0.46  (L) 05/07/2021   BILITOT 0.6 05/07/2021   ALKPHOS 69 07/03/2016   AST 10 05/07/2021   ALT 13 05/07/2021   PROT 7.2 05/07/2021   PROT 7.3 05/07/2021   ALBUMIN 3.8 07/03/2016   CALCIUM 9.6 05/07/2021   GFRAA >60 07/12/2018   QFTBGOLDPLUS NEGATIVE 05/07/2021   May 07, 2021 SPEP normal, TB Gold negative, immunoglobulins normal, hepatitis B negative, hepatitis C negative, CK 44, sed rate 6, ANA 1:40 NH, RF negative, anti-CCP negative    Speciality Comments: No specialty comments available.  Procedures:  No procedures performed Allergies: Cat hair extract, [place] grass extract, Dust mite extract, and Tape   Assessment / Plan:     Visit Diagnoses: No diagnosis found.  Orders: No orders of the defined types were placed in this encounter.  No orders of the defined types were placed in this encounter.   Face-to-face time spent with patient was *** minutes. Greater than 50% of time was spent in counseling and coordination of care.  Follow-Up Instructions: No follow-ups on file.   Bo Merino, MD  Note - This record has been created using Editor, commissioning.  Chart creation errors have been sought, but may not always  have been located. Such creation errors do not reflect on  the standard of medical care.

## 2021-06-16 ENCOUNTER — Other Ambulatory Visit: Payer: Self-pay

## 2021-06-16 ENCOUNTER — Ambulatory Visit (INDEPENDENT_AMBULATORY_CARE_PROVIDER_SITE_OTHER): Payer: 59 | Admitting: Dermatology

## 2021-06-16 ENCOUNTER — Encounter: Payer: Self-pay | Admitting: Dermatology

## 2021-06-16 DIAGNOSIS — R5383 Other fatigue: Secondary | ICD-10-CM

## 2021-06-16 DIAGNOSIS — L400 Psoriasis vulgaris: Secondary | ICD-10-CM

## 2021-06-16 DIAGNOSIS — Z79899 Other long term (current) drug therapy: Secondary | ICD-10-CM

## 2021-06-16 MED ORDER — VTAMA 1 % EX CREA: 1.0000 "application " | TOPICAL_CREAM | Freq: Every day | CUTANEOUS | 1 refills | Status: DC

## 2021-06-16 NOTE — Progress Notes (Deleted)
° °  Follow-Up Visit   Subjective  Danielle Peck is a 41 y.o. female who presents for the following: No chief complaint on file..  Psoriasis, not improved with topical therapy and negatively impacting her life  The following portions of the chart were reviewed this encounter and updated as appropriate:      {Review of Systems:34166::"No other skin or systemic complaints."}  Objective  Well appearing patient in no apparent distress; mood and affect are within normal limits.  A focused examination was performed including head, neck, hands, feet, nails, joints.. Relevant physical exam findings are noted in the Assessment and Plan.   Assessment & Plan   No follow-ups on file.

## 2021-06-16 NOTE — Patient Instructions (Addendum)
Risankizumab Injection What is this medication? RISANKIZUMAB (RIS an KIZ ue mab) reduces skin swelling, redness, itching, or rashes caused by psoriasis. It also treats psoriatic arthritis and Crohn's disease. It works on the immune system to reduce inflammation in the body. This medicine may be used for other purposes; ask your health care provider or pharmacist if you have questions. COMMON BRAND NAME(S): Skyrizi What should I tell my care team before I take this medication? They need to know if you have any of these conditions: Hepatic disease Immune system problems Infection such as tuberculosis (TB) or other bacterial, fungal or viral infections Recent or upcoming vaccine An unusual or allergic reaction to risankizumab, other medications, foods, dyes or preservatives Pregnant or trying to get pregnant Breast-feeding How should I use this medication? This medication is injected into a vein or under the skin. It is administered by someone from your care team. It may also be given at home. If you get this medication at home, you will be taught how to prepare and give it. Take it as directed on the prescription label. Keep taking it unless your care team tells you to stop. If you use a pen, be sure to take off the outer needle cover before using the dose. It is important that you put your used needles and syringes in a special sharps container. Do not put them in a trash can. If you do not have a sharps container, call your pharmacist or care team to get one. A special MedGuide will be given to you by the pharmacist with each prescription and refill. Be sure to read this information carefully each time. This medication comes with INSTRUCTIONS FOR USE. Ask your pharmacist for directions on how to use this medication. Read the information carefully. Talk to your pharmacist or care team if you have questions. Talk to your care team about the use of this medication in children. Special care may be  needed. Overdosage: If you think you have taken too much of this medicine contact a poison control center or emergency room at once. NOTE: This medicine is only for you. Do not share this medicine with others. What if I miss a dose? It is important not to miss any doses. Talk to your care team about what to do if you miss a dose. What may interact with this medication? Do not take this medication with any of the following: Live vaccines This list may not describe all possible interactions. Give your health care provider a list of all the medicines, herbs, non-prescription drugs, or dietary supplements you use. Also tell them if you smoke, drink alcohol, or use illegal drugs. Some items may interact with your medicine. What should I watch for while using this medication? Visit your care team for regular checks on your progress. Tell your care team if your symptoms do not start to get better or if they get worse. You will be tested for tuberculosis (TB) before you start this medication. If your care team prescribes any medication for TB, you should start taking the TB medication before starting this medication. Make sure to finish the full course of TB medication. This medication may increase your risk of getting an infection. Call your care team for advice if you get a fever chills, sore throat, or other symptoms of a cold or flu. Do not treat yourself. Try to avoid being around people who are sick. This medication can decrease the response to a vaccine. If you need to get  vaccinated, tell your care team if you have received this medication. Extra booster doses may be needed. Talk to your care team to see if a different vaccination schedule is needed. What side effects may I notice from receiving this medication? Side effects that you should report to your care team as soon as possible: Allergic reactions--skin rash, itching, hives, swelling of the face, lips, tongue, or throat Infection--fever,  chills, cough, sore throat, wounds that don't heal, pain or trouble when passing urine, general feeling of discomfort or being unwell Liver injury--dark yellow or brown urine, general ill feeling or flu-like symptoms, loss of appetite, right upper belly pain, unusually weak or tired, yellowing of the eyes or skin Low red blood cell counts--trouble breathing, feeling faint, lightheaded, falls, unusually weak or tired Skin infection--skin redness, swelling, warmth, or pain Side effects that usually do not require medical attention (report these to your care team if they continue or are bothersome): Back pain Fatigue Headache Joint pain Pain, redness, or irritation at injection site Stomach pain This list may not describe all possible side effects. Call your doctor for medical advice about side effects. You may report side effects to FDA at 1-800-FDA-1088. Where should I keep my medication? Keep out of the reach of children and pets. Store in a refrigerator. Do not freeze. Protect from light. Keep it in the original carton until you are ready to take it. See product for storage information. Each product may have different instructions. Remove the dose from the carton about 30 to 45 minutes before it is time for you to take it. Get rid of any unused medication after the expiration date. To get rid of medications that are no longer needed or have expired: Take the medication to a medication take-back program. Check with your pharmacy or law enforcement to find a location. If you cannot return the medication, ask your pharmacist or care team how to get rid of this medicine safely. NOTE: This sheet is a summary. It may not cover all possible information. If you have questions about this medicine, talk to your doctor, pharmacist, or health care provider.  2022 Elsevier/Gold Standard (2020-10-04 00:00:00)      Acitretin capsules What is this medication? ACITRETIN (a si TRE tin) is used to treat  severe forms of psoriasis. This medicine may be used for other purposes; ask your health care provider or pharmacist if you have questions. COMMON BRAND NAME(S): Soriatane What should I tell my care team before I take this medication? They need to know if you have any of these conditions: alcoholism depression diabetes high cholesterol heart disease kidney disease liver disease taking St. John's Wort visual problems an unusual or allergic reaction to acitretin, etretinate, vitamin A, isotretinoin, tretinoin, other medicines, foods, dyes, or preservatives pregnant or trying to get pregnant breast-feeding How should I use this medication? Take this medicine by mouth with with a glass of water. Follow the directions on the prescription label. Take this medicine with food. Take your medicine at regular intervals. Do not take your medicine more often than directed. Do not stop taking except on your doctor's advice. A special MedGuide will be given to you by the pharmacist with each prescription and refill. Be sure to read this information carefully each time. Talk to your pediatrician regarding the use of this medicine in children. Special care may be needed. Overdosage: If you think you have taken too much of this medicine contact a poison control center or emergency room at once.  NOTE: This medicine is only for you. Do not share this medicine with others. What if I miss a dose? If you miss a dose, skip the missed dose and resume your normal schedule. Do not take double or extra doses. What may interact with this medication? Do not take this medicine with any of the following medications: alcohol, including alcohol that may be in drinks, food, or medicines including over-the-counter medicines etretinate - tell your doctor if you have ever taken this medicine in the past methotrexate multivitamins or nutritional supplements that contain vitamin A progestin-only birth control pills (mini  pill) st. john's wort tetracycline type antibiotics like tetracycline, doxycycline or minocycline vitamin A type medicines like isotretinoin, tretinoin This list may not describe all possible interactions. Give your health care provider a list of all the medicines, herbs, non-prescription drugs, or dietary supplements you use. Also tell them if you smoke, drink alcohol, or use illegal drugs. Some items may interact with your medicine. What should I watch for while using this medication? Your psoriasis may get worse when you first start taking this medicine. You may have to take it for 2 to 3 months before you see the full benefit. This medicine can cause birth defects. Do not get pregnant while taking this drug. Females will need to have 2 negative pregnancy tests before starting this medicine and then monthly pregnancy tests during treatment, even if you are not sexually active. Use 2 reliable forms of birth control together for 1 month before, during, and for at least 3 years after stopping this medicine. Avoid using birth control pills that do not contain estrogen. They may not work while you are taking this medicine. If you become pregnant, miss a menstrual cycle, or stop using birth control, you must immediately stop taking this medicine. Severe birth defects may occur. Do not take this medicine before or during breast-feeding. Before you receive your prescription review the Do Your P.A.R.T. booklet, which includes the Do Your P.A.R.T. Patient Brochure, The Contraceptive Counseling Referral Form for female patients, the Patient Agreement/Informed Consent Form for female patients, and the Medication Guide. If you did not talk to your doctor about this and sign the consent form, contact your health care provider. Do not share this medicine with anyone else because of the risk of birth defects and other serious adverse effects. Do not give blood during your treatment and for 3 years after you stop taking  it. This medicine in your blood can harm an unborn baby if the blood is given to a pregnant woman. You can still receive blood transfusions while taking this medicine. If you wear contact lenses, they may feel uncomfortable. This medicine can make you more sensitive to the sun. Keep out of the sun. If you cannot avoid being in the sun, wear protective clothing and use sunscreen. Do not use sun lamps or tanning beds/booths. If you are receiving light treatment (phototherapy), your doctor may need to change your light dosages to avoid burns. Your mouth may get dry. Chewing sugarless gum or sucking hard candy, and drinking plenty of water may help. Contact your doctor if the problem does not go away or is severe. This medicine can increase cholesterol and triglyceride levels and decrease HDL (the 'good' cholesterol) levels. Your health care provider will monitor these levels and recommend appropriate therapy, including dietary changes or prescription drugs, if necessary. This medicine may affect your blood sugar levels. If you are diabetic check with your doctor or health care professional  if you notice any change in your blood sugar tests. During therapy with this medicine and for 2 months after stopping treatment, you must avoid drinks, foods, and all medicines that contain alcohol. This includes over-the-counter products that contain alcohol. Avoiding alcohol is important because alcohol changes this medicine into a drug that may take longer than 3 years to leave your body. The chance of birth defects may last longer than 3 years if you take any form of alcohol while taking this medicine or for 2 months after stopping treatment. What side effects may I notice from receiving this medication? Side effects that you should report to your doctor or health care professional as soon as possible: allergic reactions like skin rash, itching or hives, swelling of the face, lips, or tongue breathing problems changes  in vision dark urine depression and/or aggressive feelings, thoughts of self-harm or suicide frequent urination, increased thirst or hunger joint, muscle or bone pain severe or persistent nausea and vomiting and decreased appetite swelling in a leg yellowing of the eyes or skin Side effects that usually do not require medical attention (report to your doctor or health care professional if they continue or are bothersome): altered taste difficulty sleeping dry nose, mouth, and eyes hair loss increased sweating itching and peeling of your fingers, palms, or soles of the feet ringing in the ears scaly skin all over weak nails This list may not describe all possible side effects. Call your doctor for medical advice about side effects. You may report side effects to FDA at 1-800-FDA-1088. Where should I keep my medication? Keep out of the reach of children. Store at room temperature between 15 and 25 degrees C (59 and 77 degrees F). Protect from light. Avoid exposure to high temperatures and humidity after the bottle is opened. Throw away any unused medicine after the expiration date. NOTE: This sheet is a summary. It may not cover all possible information. If you have questions about this medicine, talk to your doctor, pharmacist, or health care provider.  2022 Elsevier/Gold Standard (2009-09-03 00:00:00)

## 2021-06-17 ENCOUNTER — Ambulatory Visit: Payer: 59 | Admitting: Rheumatology

## 2021-06-17 DIAGNOSIS — R5383 Other fatigue: Secondary | ICD-10-CM

## 2021-06-17 DIAGNOSIS — M722 Plantar fascial fibromatosis: Secondary | ICD-10-CM

## 2021-06-17 DIAGNOSIS — F900 Attention-deficit hyperactivity disorder, predominantly inattentive type: Secondary | ICD-10-CM

## 2021-06-17 DIAGNOSIS — Z8709 Personal history of other diseases of the respiratory system: Secondary | ICD-10-CM

## 2021-06-17 DIAGNOSIS — G8929 Other chronic pain: Secondary | ICD-10-CM

## 2021-06-17 DIAGNOSIS — Z8639 Personal history of other endocrine, nutritional and metabolic disease: Secondary | ICD-10-CM

## 2021-06-17 DIAGNOSIS — L4050 Arthropathic psoriasis, unspecified: Secondary | ICD-10-CM

## 2021-06-17 DIAGNOSIS — M79642 Pain in left hand: Secondary | ICD-10-CM

## 2021-06-17 DIAGNOSIS — Q6671 Congenital pes cavus, right foot: Secondary | ICD-10-CM

## 2021-06-17 DIAGNOSIS — L409 Psoriasis, unspecified: Secondary | ICD-10-CM

## 2021-06-17 DIAGNOSIS — Z79899 Other long term (current) drug therapy: Secondary | ICD-10-CM

## 2021-06-17 DIAGNOSIS — E785 Hyperlipidemia, unspecified: Secondary | ICD-10-CM

## 2021-06-17 DIAGNOSIS — L921 Necrobiosis lipoidica, not elsewhere classified: Secondary | ICD-10-CM

## 2021-06-18 MED ORDER — VTAMA 1 % EX CREA: 1.0000 "application " | TOPICAL_CREAM | Freq: Every day | CUTANEOUS | 1 refills | Status: AC

## 2021-06-23 LAB — CBC WITH DIFFERENTIAL/PLATELET
Absolute Monocytes: 582 cells/uL (ref 200–950)
Basophils Absolute: 42 cells/uL (ref 0–200)
Basophils Relative: 0.4 %
Eosinophils Absolute: 270 cells/uL (ref 15–500)
Eosinophils Relative: 2.6 %
HCT: 41.2 % (ref 35.0–45.0)
Hemoglobin: 13.9 g/dL (ref 11.7–15.5)
Lymphs Abs: 3078 cells/uL (ref 850–3900)
MCH: 30.7 pg (ref 27.0–33.0)
MCHC: 33.7 g/dL (ref 32.0–36.0)
MCV: 90.9 fL (ref 80.0–100.0)
MPV: 11 fL (ref 7.5–12.5)
Monocytes Relative: 5.6 %
Neutro Abs: 6427 cells/uL (ref 1500–7800)
Neutrophils Relative %: 61.8 %
Platelets: 313 10*3/uL (ref 140–400)
RBC: 4.53 10*6/uL (ref 3.80–5.10)
RDW: 12 % (ref 11.0–15.0)
Total Lymphocyte: 29.6 %
WBC: 10.4 10*3/uL (ref 3.8–10.8)

## 2021-06-23 LAB — QUANTIFERON-TB GOLD PLUS
Mitogen-NIL: >10 IU/mL
NIL: 0.06 IU/mL
QuantiFERON-TB Gold Plus: NEGATIVE
TB1-NIL: 0.16 IU/mL
TB2-NIL: 0.1 IU/mL

## 2021-06-23 LAB — HEPATITIS C ANTIBODY
Hepatitis C Ab: NONREACTIVE
SIGNAL TO CUT-OFF: 0.02 (ref <1.00)

## 2021-06-23 LAB — COMPREHENSIVE METABOLIC PANEL
AG Ratio: 1.7 (calc) (ref 1.0–2.5)
ALT: 16 U/L (ref 6–29)
AST: 9 U/L — ABNORMAL LOW (ref 10–30)
Albumin: 4.4 g/dL (ref 3.6–5.1)
Alkaline phosphatase (APISO): 58 U/L (ref 31–125)
BUN/Creatinine Ratio: 22 (calc) (ref 6–22)
BUN: 11 mg/dL (ref 7–25)
CO2: 25 mmol/L (ref 20–32)
Calcium: 9.5 mg/dL (ref 8.6–10.2)
Chloride: 100 mmol/L (ref 98–110)
Creat: 0.49 mg/dL — ABNORMAL LOW (ref 0.50–0.97)
Globulin: 2.6 g/dL (calc) (ref 1.9–3.7)
Glucose, Bld: 204 mg/dL — ABNORMAL HIGH (ref 65–99)
Potassium: 3.9 mmol/L (ref 3.5–5.3)
Sodium: 135 mmol/L (ref 135–146)
Total Bilirubin: 0.8 mg/dL (ref 0.2–1.2)
Total Protein: 7 g/dL (ref 6.1–8.1)

## 2021-06-23 LAB — ANTI-NUCLEAR AB-TITER (ANA TITER)
ANA TITER: 1:40 {titer} — ABNORMAL HIGH
ANA Titer 1: 1:40 {titer} — ABNORMAL HIGH

## 2021-06-23 LAB — LIPID PANEL
Cholesterol: 225 mg/dL — ABNORMAL HIGH (ref <200)
HDL: 33 mg/dL — ABNORMAL LOW (ref >=50)
LDL Cholesterol (Calc): 159 mg/dL (calc) — ABNORMAL HIGH
Non-HDL Cholesterol (Calc): 192 mg/dL (calc) — ABNORMAL HIGH (ref <130)
Total CHOL/HDL Ratio: 6.8 (calc) — ABNORMAL HIGH (ref <5.0)
Triglycerides: 178 mg/dL — ABNORMAL HIGH (ref <150)

## 2021-06-23 LAB — HEPATITIS B SURFACE ANTIGEN: Hepatitis B Surface Ag: NONREACTIVE

## 2021-06-23 LAB — HEPATITIS B CORE ANTIBODY, TOTAL: Hep B Core Total Ab: NONREACTIVE

## 2021-06-23 LAB — HEPATITIS B SURFACE ANTIBODY, QUANTITATIVE: Hep B S AB Quant (Post): 19 m[IU]/mL (ref >=10)

## 2021-06-23 LAB — ANA: Anti Nuclear Antibody (ANA): POSITIVE — AB

## 2021-06-24 ENCOUNTER — Encounter: Payer: Self-pay | Admitting: Dermatology

## 2021-06-24 ENCOUNTER — Telehealth: Payer: Self-pay | Admitting: *Deleted

## 2021-06-24 NOTE — Progress Notes (Signed)
° °  Follow-Up Visit   Subjective  Danielle Peck is a 41 y.o. female who presents for the following: Psoriasis (Patient here today for psoriasis on both feet x years previous treatment Halobetasol which did not help. Per patient she would like to discuss using Spevigo. Patient states that she does have joint pain. ).  Psoriasis, no improvement with topical therapy and negatively impacting her life. Location:  Duration:  Quality:  Associated Signs/Symptoms: Modifying Factors:  Severity:  Timing: Context:   Objective  Well appearing patient in no apparent distress; mood and affect are within normal limits. 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  Because of involvement of the feet this is disabling and affecting both her comfort and her walking.  She inquired about medicine cold Spevigo which is an infusion only approved for generalized pustular psoriasis.  Although this is not an appropriate neck step, an alternative biologic like Orson Ape would be a possibility.  This was discussed in some detail and she will do some more reading about this medication.    A focused examination was performed including head, neck, arms, legs, nails, joints.. Relevant physical exam findings are noted in the Assessment and Plan.   Assessment & Plan    Psoriasis vulgaris Left Middle Plantar Surface; Right Middle Plantar Surface; Left Medial Plantar Surface; Right Medial Plantar Surface  We will see if we can get Skyrizi preauthorized while she thinks over this treatment strategy.  Check labs.  Samples and prescription for Vtama topical therapy pending the decision on the biologic.  Related Procedures Comprehensive metabolic panel CBC with Differential/Platelet Hepatitis B surface antibody,quantitative Hepatitis B surface antigen Hepatitis B core antibody, total Hepatitis C  antibody QuantiFERON-TB Gold Plus ANA Lipid Panel Anti-nuclear ab-titer (ANA titer)  Related Medications Tapinarof (VTAMA) 1 % CREA Apply 1 application topically daily at 6 (six) AM.  Encounter for long-term (current) use of medications  Related Procedures Comprehensive metabolic panel CBC with Differential/Platelet Hepatitis B surface antibody,quantitative Hepatitis B surface antigen Hepatitis B core antibody, total Hepatitis C antibody QuantiFERON-TB Gold Plus ANA Lipid Panel  Fatigue, unspecified type  Related Procedures Comprehensive metabolic panel CBC with Differential/Platelet Hepatitis B surface antibody,quantitative Hepatitis B surface antigen Hepatitis B core antibody, total Hepatitis C antibody QuantiFERON-TB Gold Plus ANA Lipid Panel      I, Lavonna Monarch, MD, have reviewed all documentation for this visit.  The documentation on 06/24/21 for the exam, diagnosis, procedures, and orders are all accurate and complete.I, Lavonna Monarch, MD, have reviewed all documentation for this visit. The documentation on 06/24/21 for the exam, diagnosis, procedures, and orders are all accurate and complete.

## 2021-06-24 NOTE — Telephone Encounter (Signed)
Labs reviewed- okay per dr tafeen to start skyrizi- intial and main.  prescription sent via [facility] portal.  Labs to patient and patient aware prescription sent to specialty pharmacy.  ?

## 2021-06-30 ENCOUNTER — Telehealth: Payer: Self-pay | Admitting: *Deleted

## 2021-06-30 NOTE — Telephone Encounter (Signed)
Check status of patient skyrizi- per [facility]:  ? ? ?Hi! My name is Mardene Celeste, can you please provide your name and title within the office? ? ? ?12:03 ?good morning...needing to check status of pt ? ? ?12:03 ?Daina Cara- nurse ? ? ?12:03 ?Can you please provide the patient's name and DOB? ? ?12:05 ?Farrin Orlich- [id number] ? ? ?12:05 ?ePA pending as of now ? ?12:06 ?thank you  ? ? ? ?

## 2021-07-02 ENCOUNTER — Telehealth: Payer: Self-pay | Admitting: *Deleted

## 2021-07-02 NOTE — Telephone Encounter (Signed)
Check status for skyrizi via [facility] portal ? ? ?For this [facility]- it is also pending/in review with their insurance but we will be follow up today to check status of PA.  ?

## 2021-07-07 ENCOUNTER — Telehealth: Payer: Self-pay

## 2021-07-07 NOTE — Telephone Encounter (Signed)
Fax received from [place] stating that the patient's Danielle Peck is approved from 07/03/2021 through 07/04/2022. ?

## 2021-07-07 NOTE — Telephone Encounter (Signed)
Fax received from [place] stating that the patient's Orson Ape is approved from 07/03/2021 to 07/04/2022. ?

## 2021-07-08 NOTE — Telephone Encounter (Signed)
Patient agreed to delivery date as of 07/10/21 ?And the copay is 5$ per [facility]  ?

## 2021-07-31 NOTE — Progress Notes (Signed)
? ?Office Visit Note ? ?Patient: Danielle Peck             ?Date of Birth: 20-Nov-1980           ?MRN: [id number]             ?PCP: Pcp, No ?Referring: No ref. provider found ?Visit Date: 08/13/2021 ?Occupation: _0 @ ? ?Subjective:  ?Pain in joints and psoriasis ? ?History of Present Illness: Danielle Peck is a 41 y.o. female with history of polyarthralgia and psoriasis.  She states she continues to have pain in both ankles off-and-on.  She has not noticed any joint swelling.  She believes the ankle joint discomfort comes from the rash on her feet.  She states when the rash gets worse on the her feet she is walking on her toes which causes discomfort in her feet.  She has not had any episodes of Planter fasciitis since her last visit.  None of the other joints are painful.  She states she decided not to go on Taltz.  She was also evaluated by Dr. Denna Haggard who recommended a Skyrizi.  Patient decided against all the medications.  She states she has been using topical tapinarof 1% cream which has been very effective. ? ?Activities of Daily Living:  ?Patient reports morning stiffness for 20  minutes.   ?Patient Denies nocturnal pain.  ?Difficulty dressing/grooming: Denies ?Difficulty climbing stairs: Denies ?Difficulty getting out of chair: Denies ?Difficulty using hands for taps, buttons, cutlery, and/or writing: Reports ? ?Review of Systems  ?Constitutional:  Positive for fatigue.  ?HENT:  Negative for mouth sores, mouth dryness and nose dryness.   ?Eyes:  Negative for pain, itching and dryness.  ?Respiratory:  Negative for shortness of breath and difficulty breathing.   ?Cardiovascular:  Negative for chest pain and palpitations.  ?Gastrointestinal:  Negative for blood in stool, constipation and diarrhea.  ?Endocrine: Negative for increased urination.  ?Genitourinary:  Negative for difficulty urinating.  ?Musculoskeletal:  Positive for joint pain, joint pain, myalgias, morning stiffness, muscle tenderness and  myalgias. Negative for joint swelling.  ?Skin:  Negative for color change, rash and redness.  ?Allergic/Immunologic: Positive for susceptible to infections.  ?Neurological:  Negative for dizziness, numbness, headaches, memory loss and weakness.  ?Hematological:  Negative for bruising/bleeding tendency.  ?Psychiatric/Behavioral:  Negative for confusion.   ? ?PMFS History:  ?Patient Active Problem List  ? Diagnosis Date Noted  ? History of asthma 05/07/2021  ? History of diabetes mellitus 05/07/2021  ? Dyslipidemia 05/07/2021  ? Attention deficit hyperactivity disorder (ADHD), predominantly inattentive type 05/07/2021  ? Breast abscess 07/03/2016  ?  ?Past Medical History:  ?Diagnosis Date  ? Asthma   ? Diabetes mellitus without complication ([place])   ? HSV-2 infection   ? Non-healing wound of lower extremity 2018  ? Psoriasis   ?  ?Family History  ?Problem Relation Age of Onset  ? Hypertension Mother   ? Alopecia Mother   ? ?Past Surgical History:  ?Procedure Laterality Date  ? APPENDECTOMY    ? BREAST MASS EXCISION Right   ? INCISION AND DRAINAGE OF WOUND Right 07/03/2016  ? Procedure: IRRIGATION AND DEBRIDEMENT WOUND;  Surgeon: Ralene Ok, MD;  Location: [facility];  Service: General;  Laterality: Right;  ? WOUND EXPLORATION Left 07/13/2018  ? Procedure: WOUND EXPLORATION LEFT INDEX FINGER WITH TENDON REPAIR;  Surgeon: Iran Planas, MD;  Location: [facility];  Service: Orthopedics;  Laterality: Left;  ? ?Social History  ? ?Social History Narrative  ?  Not on file  ? ?Immunization History  ?Administered Date(s) Administered  ? PFIZER(Purple Top)SARS-COV-2 Vaccination 06/17/2019, 07/11/2019  ?  ? ?Objective: ?Vital Signs: BP 103/72 (BP Location: Left Arm, Patient Position: Sitting, Cuff Size: Normal)   Pulse (!) 106   Ht 5' 3.5" (1.613 m)   Wt 187 lb 3.2 oz (84.9 kg)   BMI 32.64 kg/m?   ? ?Physical Exam ?Vitals and nursing note reviewed.  ?Constitutional:   ?   Appearance: She is well-developed.   ?HENT:  ?   Head: Normocephalic and atraumatic.  ?Eyes:  ?   Conjunctiva/sclera: Conjunctivae normal.  ?Cardiovascular:  ?   Rate and Rhythm: Normal rate and regular rhythm.  ?   Heart sounds: Normal heart sounds.  ?Pulmonary:  ?   Effort: Pulmonary effort is normal.  ?   Breath sounds: Normal breath sounds.  ?Abdominal:  ?   General: Bowel sounds are normal.  ?   Palpations: Abdomen is soft.  ?Musculoskeletal:  ?   Cervical back: Normal range of motion.  ?Lymphadenopathy:  ?   Cervical: No cervical adenopathy.  ?Skin: ?   General: Skin is warm and dry.  ?   Capillary Refill: Capillary refill takes less than 2 seconds.  ?Neurological:  ?   Mental Status: She is alert and oriented to person, place, and time.  ?Psychiatric:     ?   Behavior: Behavior normal.  ?  ? ?Musculoskeletal Exam: C-spine thoracic and lumbar spine were in good range of motion.  She had no SI joint tenderness.  Shoulder joints, elbow joints, wrist joints, MCPs PIPs and DIPs with good range of motion with no synovitis.  Hip joints, knee joints, ankles, MTPs with good range of motion with no synovitis.  She had no tenderness over Achilles tendon or planter fascia. ? ?CDAI Exam: ?CDAI Score: -- ?Patient Global: --; Provider Global: -- ?Swollen: --; Tender: -- ?Joint Exam 08/13/2021  ? ?No joint exam has been documented for this visit  ? ?There is currently no information documented on the homunculus. Go to the Rheumatology activity and complete the homunculus joint exam. ? ?Investigation: ?No additional findings. ? ?Imaging: ?No results found. ? ?Recent Labs: ?Lab Results  ?Component Value Date  ? WBC 10.4 06/19/2021  ? HGB 13.9 06/19/2021  ? PLT 313 06/19/2021  ? NA 135 06/19/2021  ? K 3.9 06/19/2021  ? CL 100 06/19/2021  ? CO2 25 06/19/2021  ? GLUCOSE 204 (H) 06/19/2021  ? BUN 11 06/19/2021  ? CREATININE 0.49 (L) 06/19/2021  ? BILITOT 0.8 06/19/2021  ? ALKPHOS 69 07/03/2016  ? AST 9 (L) 06/19/2021  ? ALT 16 06/19/2021  ? PROT 7.0 06/19/2021  ?  ALBUMIN 3.8 07/03/2016  ? CALCIUM 9.5 06/19/2021  ? GFRAA >60 07/12/2018  ? QFTBGOLDPLUS NEGATIVE 06/19/2021  ? ?June 19, 2021 LDL 178, HDL 33, TB Gold negative, hepatitis B-, hepatitis C negative, ANA positive ?May 07, 2021 SPEP normal, immunoglobulins normal, TB Gold negative, hepatitis B negative, hepatitis C negative, ANA 1: 40, RF negative, anti-CCP negative, ESR 6, CK 44 ? ?Speciality Comments: He had a good response to Humirax 1 year, Stelarax 2 years ? ?Procedures:  ?No procedures performed ?Allergies: Cat hair extract, [place] grass extract, Dust mite extract, and Tape  ? ?Assessment / Plan:     ?Visit Diagnoses: Polyarthralgia -patient gave history of inflammatory arthritis involving hands, intermittent ankle joint swelling, left planter fasciitis.  She states her symptoms have resolved.  She states since she has  been using topical Vtama percent cream her rash has improved and she is walking better.  She is not having any swelling now.  She decided against tiles which was advised by [place] and also the [facility] which was advised by Dr. Denna Haggard.  I advised her to contact me in case she develops any joint swelling.  She will return only on as needed basis. ? ?Psoriasis - Involving bilateral hands and bilateral feet.  A prescription for clobetasol was given.  She is using tapinarof 1% cream which has been effective.  She will follow-up with Dr. Denna Haggard. ? ?High risk medication use - Humira x1 year,Stelara x 2 years.  She is currently not using any Biologics or DMARDs. ? ?Pain in both hands - No synovitis was noted on the examination.  X-rays were unremarkable.  X-ray findings were discussed with the patient. ? ?Chronic pain of both ankles - Patient gives history of intermittent swelling and discomfort.  No synovitis was noted.  She states that she has intermittent pain in her ankles which she relates to the rash on her feet and not walking properly due to the rash.  I advised her to contact me in case she  develops any swelling. ? ?Chronic pain of both feet - No synovitis was noted.  Calluses were noted under metatarsals.  X-rays were unremarkable. ? ?Pes cavus of both feet - Proper fitting shoes were advised. ? ?Plantar fasc

## 2021-08-11 ENCOUNTER — Ambulatory Visit: Payer: 59 | Admitting: Dermatology

## 2021-08-11 ENCOUNTER — Encounter: Payer: Self-pay | Admitting: Dermatology

## 2021-08-11 DIAGNOSIS — B351 Tinea unguium: Secondary | ICD-10-CM | POA: Diagnosis not present

## 2021-08-11 DIAGNOSIS — L409 Psoriasis, unspecified: Secondary | ICD-10-CM | POA: Diagnosis not present

## 2021-08-13 ENCOUNTER — Ambulatory Visit (INDEPENDENT_AMBULATORY_CARE_PROVIDER_SITE_OTHER): Payer: 59 | Admitting: Rheumatology

## 2021-08-13 ENCOUNTER — Encounter: Payer: Self-pay | Admitting: Rheumatology

## 2021-08-13 VITALS — BP 103/72 | HR 106 | Ht 63.5 in | Wt 187.2 lb

## 2021-08-13 DIAGNOSIS — G8929 Other chronic pain: Secondary | ICD-10-CM

## 2021-08-13 DIAGNOSIS — M722 Plantar fascial fibromatosis: Secondary | ICD-10-CM

## 2021-08-13 DIAGNOSIS — Q6672 Congenital pes cavus, left foot: Secondary | ICD-10-CM

## 2021-08-13 DIAGNOSIS — Z8639 Personal history of other endocrine, nutritional and metabolic disease: Secondary | ICD-10-CM

## 2021-08-13 DIAGNOSIS — M79671 Pain in right foot: Secondary | ICD-10-CM

## 2021-08-13 DIAGNOSIS — M79641 Pain in right hand: Secondary | ICD-10-CM | POA: Diagnosis not present

## 2021-08-13 DIAGNOSIS — L4050 Arthropathic psoriasis, unspecified: Secondary | ICD-10-CM

## 2021-08-13 DIAGNOSIS — Z79899 Other long term (current) drug therapy: Secondary | ICD-10-CM | POA: Diagnosis not present

## 2021-08-13 DIAGNOSIS — Q6671 Congenital pes cavus, right foot: Secondary | ICD-10-CM

## 2021-08-13 DIAGNOSIS — Z8709 Personal history of other diseases of the respiratory system: Secondary | ICD-10-CM

## 2021-08-13 DIAGNOSIS — L409 Psoriasis, unspecified: Secondary | ICD-10-CM

## 2021-08-13 DIAGNOSIS — M79642 Pain in left hand: Secondary | ICD-10-CM

## 2021-08-13 DIAGNOSIS — F900 Attention-deficit hyperactivity disorder, predominantly inattentive type: Secondary | ICD-10-CM

## 2021-08-13 DIAGNOSIS — M25571 Pain in right ankle and joints of right foot: Secondary | ICD-10-CM

## 2021-08-13 DIAGNOSIS — E785 Hyperlipidemia, unspecified: Secondary | ICD-10-CM

## 2021-08-13 DIAGNOSIS — M25572 Pain in left ankle and joints of left foot: Secondary | ICD-10-CM

## 2021-08-13 DIAGNOSIS — M79672 Pain in left foot: Secondary | ICD-10-CM

## 2021-08-13 DIAGNOSIS — L921 Necrobiosis lipoidica, not elsewhere classified: Secondary | ICD-10-CM

## 2021-08-29 ENCOUNTER — Encounter: Payer: Self-pay | Admitting: Dermatology

## 2021-08-29 NOTE — Progress Notes (Signed)
? ?  Follow-Up Visit ?  ?Subjective  ?Danielle Peck is a 41 y.o. female who presents for the following: Follow-up (Vtama improved the feet and she never started the skyrizi she don't want to compromise her immune system). ? ?Psoriasis mainly feet, check toenails ?Location:  ?Duration:  ?Quality:  ?Associated Signs/Symptoms: ?Modifying Factors:  ?Severity:  ?Timing: ?Context:  ? ?Objective  ?Well appearing patient in no apparent distress; mood and affect are within normal limits. ?Right Foot - Anterior ?Feet psoriasis on the feet improved with topical vytama has been life changing.  Scale and inflammation 80% improved. ? ?Left Hallux Toe Nail Plate ?Chronic asymptomatic tinea.  Essentially all treatment options discussed with some emphasis in effectiveness of all topical therapies remote risk of a serious side effect with oral therapy ? ? ? ?A focused examination was performed including arms, legs, hands, feet, nails.. Relevant physical exam findings are noted in the Assessment and Plan. ? ? ?Assessment & Plan  ? ? ?Psoriasis ?Right Foot - Anterior ? ?Continue the topical vtama; may try tapering if doing well. ? ?Toenail fungus ?Left Hallux Toe Nail Plate ? ?Defer treatment at this time per patient  ? ? ? ? ? ?I, Lavonna Monarch, MD, have reviewed all documentation for this visit.  The documentation on 08/29/21 for the exam, diagnosis, procedures, and orders are all accurate and complete. ?

## 2021-09-10 ENCOUNTER — Telehealth: Payer: Self-pay

## 2021-09-10 NOTE — Telephone Encounter (Signed)
Fax received from [facility] stating that they spoke with the patient regarding her Orson Ape and patient informed them that her therapy was changing.

## 2022-08-12 ENCOUNTER — Ambulatory Visit: Payer: 59 | Admitting: Dermatology

## 2023-07-23 ENCOUNTER — Other Ambulatory Visit: Payer: Self-pay

## 2023-07-23 ENCOUNTER — Emergency Department (HOSPITAL_COMMUNITY)

## 2023-07-23 ENCOUNTER — Encounter (HOSPITAL_COMMUNITY): Payer: Self-pay

## 2023-07-23 ENCOUNTER — Emergency Department (HOSPITAL_COMMUNITY)
Admission: EM | Admit: 2023-07-23 | Discharge: 2023-07-23 | Disposition: A | Discharge status: Home / Self Care | Attending: Emergency Medicine | Admitting: Emergency Medicine

## 2023-07-23 DIAGNOSIS — E119 Type 2 diabetes mellitus without complications: Secondary | ICD-10-CM | POA: Insufficient documentation

## 2023-07-23 DIAGNOSIS — Z794 Long term (current) use of insulin: Secondary | ICD-10-CM | POA: Diagnosis not present

## 2023-07-23 DIAGNOSIS — Z7984 Long term (current) use of oral hypoglycemic drugs: Secondary | ICD-10-CM | POA: Insufficient documentation

## 2023-07-23 DIAGNOSIS — K5900 Constipation, unspecified: Secondary | ICD-10-CM | POA: Diagnosis present

## 2023-07-23 MED ORDER — GOLYTELY 236 G PO SOLR: 4000.0000 mL | Freq: Once | ORAL | 0 refills | Status: AC

## 2023-07-23 MED ORDER — MAGNESIUM CITRATE PO SOLN
1.0000 | Freq: Once | ORAL | Status: DC
  Filled 2023-07-23: qty 296

## 2023-07-23 NOTE — Discharge Instructions (Signed)
 I have sent your bowel prep, to use, as necessary, to help clean out your bowels.  I am glad you are able to have some relief, after having a bowel movement here.  You should incorporate MiraLAX once daily, into your diet, to help with constipation.  Return to the ER if you have intractable nausea, vomiting

## 2023-07-23 NOTE — ED Triage Notes (Signed)
 Pt arrived reporting abdominal pain/constipation for a few days. LBM a few days ago. States took a suppository this morning. Pt on new medication, Rybelsus. Not drinking enough water. Denies N/V

## 2023-07-23 NOTE — ED Provider Notes (Signed)
 [place] EMERGENCY DEPARTMENT AT [facility] Provider Note   CSN: [id number] Arrival date & time: 07/23/23  1304     History  Chief Complaint  Patient presents with   Abdominal Pain   Constipation    Danielle Peck is a 43 y.o. female, hx of DMII, who presents to the ED 2/2 to difficulty having a bowel movement, for the last 3 to 4 days.  She states she tried a suppository this morning, but it did not work.  She states that she feels like she has the urge to defecate, but cannot.  Denies any nausea, or vomiting.  States she is having cramping in her lower abdomen currently.  Denies any urinary symptoms  Home Medications Prior to Admission medications   Medication Sig Start Date End Date Taking? Authorizing Provider  polyethylene glycol (GOLYTELY) 236 g solution Take 4,000 mLs by mouth once for 1 dose. 07/23/23 07/23/23 Yes Rahma Meller L, PA  albuterol (PROVENTIL HFA;VENTOLIN HFA) 108 (90 Base) MCG/ACT inhaler Inhale 2 puffs into the lungs every 6 (six) hours as needed for wheezing or shortness of breath. 02/14/16   Wallis Bamberg, PA-C  atorvastatin (LIPITOR) 10 MG tablet 1 tablet 01/25/20   [provider]  clobetasol cream (TEMOVATE) 0.05 % Apply 1 application topically 2 (two) times daily. Patient not taking: Reported on 08/13/2021 05/07/21   Pollyann Savoy, MD  Continuous Blood Gluc Transmit (DEXCOM G6 TRANSMITTER) MISC See admin instructions. 06/23/21   [provider]  Insulin Pen Needle (PEN NEEDLES) 32G X 5 MM MISC 1 Device by Does not apply route 4 (four) times daily. For use with insulin pen needles. Refills per PCP 07/06/16   Jerald Kief, MD  JARDIANCE 10 MG TABS tablet Take 10 mg by mouth daily. 07/30/21   [provider]  LEVEMIR FLEXTOUCH 100 UNIT/ML FlexTouch Pen SMARTSIG:60 Unit(s) SUB-Q Every Night 04/07/21   [provider]  levonorgestrel (MIRENA) 20 MCG/24HR IUD 1 Intra Uterine Device by Intrauterine route once.    [provider]  meloxicam (MOBIC) 15 MG tablet Take 15 mg by mouth as needed. 05/06/21   [provider]  metFORMIN (GLUCOPHAGE) 1000 MG tablet Take 1,000 mg by mouth 2 (two) times daily with a meal. Per pt it is 'Metamorphin'    Alver Fisher, RN  sertraline (ZOLOFT) 100 MG tablet Take 200 mg by mouth daily. 07/23/21   [provider]  Tapinarof (VTAMA) 1 % CREA Apply 1 application topically daily at 6 (six) AM. 06/18/21   Janalyn Harder, MD      Allergies    Cat dander, [place] grass extract, Dust mite extract, and Tape    Review of Systems   Review of Systems  Gastrointestinal:  Positive for abdominal pain and constipation. Negative for nausea and vomiting.    Physical Exam Updated Vital Signs BP 98/74   Pulse (!) 105   Ht 5\' 4"  (1.626 m)   Wt 84.9 kg   SpO2 98%   BMI 32.13 kg/m  Physical Exam Vitals and nursing note reviewed.  Constitutional:      General: She is not in acute distress.    Appearance: She is well-developed.  HENT:     Head: Normocephalic and atraumatic.  Eyes:     Conjunctiva/sclera: Conjunctivae normal.  Cardiovascular:     Rate and Rhythm: Normal rate and regular rhythm.     Heart sounds: No murmur heard. Pulmonary:     Effort: Pulmonary effort is  normal. No respiratory distress.     Breath sounds: Normal breath sounds.  Abdominal:     Palpations: Abdomen is soft.     Tenderness: There is generalized abdominal tenderness.  Musculoskeletal:        General: No swelling.     Cervical back: Neck supple.  Skin:    General: Skin is warm and dry.     Capillary Refill: Capillary refill takes less than 2 seconds.  Neurological:     Mental Status: She is alert.  Psychiatric:        Mood and Affect: Mood normal.     ED Results / Procedures / Treatments   Labs (all labs ordered are listed, but only abnormal results are displayed) Labs Reviewed - No data to display   EKG None  Radiology DG Abdomen 1 View Result Date:  07/23/2023 CLINICAL DATA:  c/o constipation.  Abdominal pain. EXAM: ABDOMEN - 1 VIEW COMPARISON:  None Available. FINDINGS: The bowel gas pattern is non-obstructive. There is moderate stool burden, mainly in the ascending colon, compatible with colonic hypomotility. No evidence of pneumoperitoneum, within the limitations of a supine film. No acute osseous abnormalities. The soft tissues are within normal limits. Surgical changes, devices, tubes and lines: Presumed T-shaped contraceptive device noted overlying the lower sacrum. IMPRESSION: Nonobstructive bowel gas pattern. Moderate stool burden, mainly in the ascending colon, compatible with colonic hypomotility. Electronically Signed   By: Jules Schick M.D.   On: 07/23/2023 14:55    Procedures Procedures    Medications Ordered in ED Medications - No data to display   ED Course/ Medical Decision Making/ A&P                                 Medical Decision Making Is a 43 year old female, here for constipation, has not had a bowel movement several days, reports abdominal cramping.  She is overall well-appearing, she does not have any distention, no nausea, vomiting.  Will obtain KUB, and ordered mag citrate, to help with constipation.  Was informed by tech, prior to the results of x-ray, that patient was able to have a bowel movement, is feeling much better, requesting to be discharged home.  Discharged home with strict return precautions  Amount and/or Complexity of Data Reviewed Labs: ordered. Radiology: ordered.    Details: KUB unremarkable Discussion of management or test interpretation with external provider(s): Reviewed KUB it is unremarkable, patient had a good bowel movement prior to discharge.  Sent home on GoLytely for complete bowel prep, and encouraged to incorporate MiraLAX in her regimen, to help with constipation  Risk OTC drugs. Prescription drug management.   Final Clinical Impression(s) / ED Diagnoses Final diagnoses:   Constipation, unspecified constipation type    Rx / DC Orders ED Discharge Orders          Ordered    polyethylene glycol (GOLYTELY) 236 g solution   Once        07/23/23 1427              Ariahna Smiddy L, PA 07/23/23 2124    Elayne Snare K, DO 07/26/23 [phone]

## 2023-08-17 ENCOUNTER — Other Ambulatory Visit: Payer: Self-pay | Admitting: Obstetrics

## 2023-08-17 DIAGNOSIS — R928 Other abnormal and inconclusive findings on diagnostic imaging of breast: Secondary | ICD-10-CM

## 2023-08-31 ENCOUNTER — Ambulatory Visit
Admission: RE | Admit: 2023-08-31 | Discharge: 2023-08-31 | Disposition: A | Discharge status: Home / Self Care | Source: Ambulatory Visit | Attending: Obstetrics | Admitting: Obstetrics

## 2023-08-31 ENCOUNTER — Other Ambulatory Visit: Payer: Self-pay | Admitting: Obstetrics

## 2023-08-31 DIAGNOSIS — R928 Other abnormal and inconclusive findings on diagnostic imaging of breast: Secondary | ICD-10-CM

## 2023-08-31 DIAGNOSIS — N6310 Unspecified lump in the right breast, unspecified quadrant: Secondary | ICD-10-CM

## 2023-09-02 ENCOUNTER — Ambulatory Visit
Admission: RE | Admit: 2023-09-02 | Discharge: 2023-09-02 | Disposition: A | Discharge status: Home / Self Care | Source: Ambulatory Visit | Attending: Obstetrics | Admitting: Obstetrics

## 2023-09-02 DIAGNOSIS — N6310 Unspecified lump in the right breast, unspecified quadrant: Secondary | ICD-10-CM

## 2023-09-02 HISTORY — PX: BREAST BIOPSY: SHX20

## 2023-11-14 ENCOUNTER — Other Ambulatory Visit: Payer: Self-pay | Admitting: Medical Genetics

## 2023-11-23 ENCOUNTER — Other Ambulatory Visit

## 2023-11-30 ENCOUNTER — Other Ambulatory Visit

## 2024-01-30 ENCOUNTER — Other Ambulatory Visit: Payer: Self-pay | Admitting: Medical Genetics

## 2024-01-30 DIAGNOSIS — Z006 Encounter for examination for normal comparison and control in clinical research program: Secondary | ICD-10-CM
